# Patient Record
Sex: Male | Born: 1945 | Race: Black or African American | Hispanic: No | Marital: Married | Smoking: Heavy tobacco smoker
Health system: Southern US, Community
[De-identification: ages and names within clinical notes are randomized; demographics above are authoritative.]

## PROBLEM LIST (undated history)

## (undated) DIAGNOSIS — I10 Essential (primary) hypertension: Secondary | ICD-10-CM

## (undated) DIAGNOSIS — E119 Type 2 diabetes mellitus without complications: Secondary | ICD-10-CM

## (undated) DIAGNOSIS — K7460 Unspecified cirrhosis of liver: Secondary | ICD-10-CM

## (undated) HISTORY — PX: WRIST SURGERY: SHX841

---

## 2015-01-18 ENCOUNTER — Emergency Department (HOSPITAL_COMMUNITY)
Admission: EM | Admit: 2015-01-18 | Discharge: 2015-01-18 | Disposition: A | Discharge status: Home / Self Care | Payer: Medicare Other | Attending: Emergency Medicine | Admitting: Emergency Medicine

## 2015-01-18 ENCOUNTER — Encounter (HOSPITAL_COMMUNITY): Payer: Self-pay

## 2015-01-18 DIAGNOSIS — I10 Essential (primary) hypertension: Secondary | ICD-10-CM | POA: Diagnosis not present

## 2015-01-18 DIAGNOSIS — K920 Hematemesis: Secondary | ICD-10-CM | POA: Diagnosis present

## 2015-01-18 DIAGNOSIS — F17200 Nicotine dependence, unspecified, uncomplicated: Secondary | ICD-10-CM | POA: Insufficient documentation

## 2015-01-18 DIAGNOSIS — E119 Type 2 diabetes mellitus without complications: Secondary | ICD-10-CM | POA: Diagnosis not present

## 2015-01-18 HISTORY — DX: Unspecified cirrhosis of liver: K74.60

## 2015-01-18 HISTORY — DX: Type 2 diabetes mellitus without complications: E11.9

## 2015-01-18 HISTORY — DX: Essential (primary) hypertension: I10

## 2015-01-18 LAB — CBC WITH DIFFERENTIAL/PLATELET
BASOS ABS: 0 10*3/uL (ref 0.0–0.1)
Basophils Relative: 0 %
Eosinophils Absolute: 0.2 10*3/uL (ref 0.0–0.7)
Eosinophils Relative: 2 %
HEMATOCRIT: 38 % — AB (ref 39.0–52.0)
HEMOGLOBIN: 14 g/dL (ref 13.0–17.0)
LYMPHS PCT: 35 %
Lymphs Abs: 3.9 10*3/uL (ref 0.7–4.0)
MCH: 30.2 pg (ref 26.0–34.0)
MCHC: 36.8 g/dL — ABNORMAL HIGH (ref 30.0–36.0)
MCV: 82.1 fL (ref 78.0–100.0)
MONO ABS: 0.8 10*3/uL (ref 0.1–1.0)
Monocytes Relative: 7 %
NEUTROS ABS: 6.1 10*3/uL (ref 1.7–7.7)
Neutrophils Relative %: 56 %
Platelets: 172 10*3/uL (ref 150–400)
RBC: 4.63 MIL/uL (ref 4.22–5.81)
RDW: 13.1 % (ref 11.5–15.5)
WBC: 11 10*3/uL — ABNORMAL HIGH (ref 4.0–10.5)

## 2015-01-18 LAB — ABO/RH: ABO/RH(D): O NEG

## 2015-01-18 LAB — COMPREHENSIVE METABOLIC PANEL
ALK PHOS: 100 U/L (ref 38–126)
ALT: 25 U/L (ref 17–63)
AST: 28 U/L (ref 15–41)
Albumin: 3.8 g/dL (ref 3.5–5.0)
Anion gap: 11 (ref 5–15)
BILIRUBIN TOTAL: 0.6 mg/dL (ref 0.3–1.2)
BUN: 13 mg/dL (ref 6–20)
CALCIUM: 9.6 mg/dL (ref 8.9–10.3)
CO2: 23 mmol/L (ref 22–32)
CREATININE: 1.26 mg/dL — AB (ref 0.61–1.24)
Chloride: 102 mmol/L (ref 101–111)
GFR calc Af Amer: >60 mL/min (ref >60)
GFR, EST NON AFRICAN AMERICAN: 57 mL/min — AB (ref >60)
Glucose, Bld: 194 mg/dL — ABNORMAL HIGH (ref 65–99)
Potassium: 3.7 mmol/L (ref 3.5–5.1)
Sodium: 136 mmol/L (ref 135–145)
TOTAL PROTEIN: 7.8 g/dL (ref 6.5–8.1)

## 2015-01-18 LAB — TYPE AND SCREEN
ABO/RH(D): O NEG
Antibody Screen: NEGATIVE

## 2015-01-18 LAB — PROTIME-INR
INR: 1.08 (ref 0.00–1.49)
Prothrombin Time: 14.2 seconds (ref 11.6–15.2)

## 2015-01-18 LAB — LIPASE, BLOOD: LIPASE: 57 U/L — AB (ref 11–51)

## 2015-01-18 MED ORDER — PANTOPRAZOLE SODIUM 20 MG PO TBEC: 20.0000 mg | DELAYED_RELEASE_TABLET | Freq: Every day | ORAL | Status: DC

## 2015-01-18 MED ORDER — FAMOTIDINE 20 MG PO TABS: 20.0000 mg | ORAL_TABLET | Freq: Two times a day (BID) | ORAL | Status: AC

## 2015-01-18 NOTE — ED Notes (Addendum)
Pt arrives EMS with c/o viomiting blood approx 30 cc.pta.  intermittent vomiting over last 2 weeks and another episode this am.

## 2015-01-18 NOTE — Discharge Instructions (Signed)
Hematemesis Hematemesis is when you vomit blood. It is a sign of bleeding in the upper part of your digestive tract. This is also called your gastrointestinal (GI) tract. Your upper GI tract includes your mouth, throat, esophagus, stomach, and the first part of your small intestine (duodenum).  Hematemesis is usually caused by bleeding from your esophagus or stomach. You may suddenly vomit bright red blood. You might also vomit old blood. It may look like coffee grounds. You may also have other symptoms, such as:  Stomach pain.  Heartburn.  Black and tarry stool.  HOME CARE INSTRUCTIONS  Watch your hematemesis for any changes. The following actions may help to lessen any discomfort you are feeling:  Take medicines only as directed by your health care provider. Do not take aspirin, ibuprofen, or any other anti-inflammatory medicine without approval from your health care provider.  Rest as needed.  Drink small sips of clear liquids often, as long as you can keep them down. Try to drink enough fluids to keep your urine clear or pale yellow.  Do not drink alcohol.  Do not use any tobacco products, including cigarettes, chewing tobacco, or electronic cigarettes. If you need help quitting, ask your health care provider.  Keep all follow-up visits as directed by your health care provider. This is important. SEEK MEDICAL CARE IF:   The vomiting of blood worsens, or begins again after it has stopped.  You have persistent stomach pain.  You have nausea, indigestion, or heartburn.  You feel weak or dizzy. SEEK IMMEDIATE MEDICAL CARE IF:   You faint or feel extremely weak.  You have a rapid heartbeat.  You are urinating less than normal or not at all.  You have persistent vomiting.  You vomit large amounts of bloody or dark material.  You vomit bright red blood.  You pass large, dark, or bloody stools.  You have chest pain or trouble breathing.   This information is not  intended to replace advice given to you by your health care provider. Make sure you discuss any questions you have with your health care provider.   Document Released: 03/01/2004 Document Revised: 02/12/2014 Document Reviewed: 09/16/2013 Elsevier Interactive Patient Education 2016 [employer]  

## 2015-01-18 NOTE — ED Notes (Signed)
Pt hom e ambulatory with family after verbalizes understanding of instructions.

## 2015-01-18 NOTE — ED Provider Notes (Signed)
CSN: [id number]     Arrival date & time 01/18/15  1728 History   First MD Initiated Contact with Patient 01/18/15 1730     Chief Complaint  Patient presents with  . Hematemesis    Patient is a 69 y.o. male presenting with vomiting. The history is provided by the patient.  Emesis Severity:  Mild Timing:  Rare Number of daily episodes:  1 Quality:  Bright red blood Progression:  Resolved Chronicity:  New Relieved by:  Nothing Associated symptoms: no abdominal pain, no chills, no diarrhea, no fever, no headaches and no sore throat   Risk factors: alcohol use     Past Medical History  Diagnosis Date  . Hypertension   . Diabetes mellitus without complication (HCC)   . 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 Wrist surgery     Family History  Problem Relation Age of Onset  . Diabetes Mother   . Hypertension Mother    Social History  Substance Use Topics  . Smoking status: Heavy Tobacco Smoker  . Smokeless tobacco: None  . Alcohol Use: 4.8 oz/week    8 Shots of liquor per week     Comment: most days    Review of Systems  Constitutional: Negative for fever and chills.  HENT: Negative for rhinorrhea and sore throat.   Eyes: Negative for visual disturbance.  Respiratory: Negative for cough and shortness of breath.   Cardiovascular: Negative for chest pain.  Gastrointestinal: Positive for vomiting. Negative for nausea, abdominal pain, diarrhea, constipation, blood in stool, abdominal distention and anal bleeding.       Hematemesis  Genitourinary: Negative for dysuria and hematuria.  Musculoskeletal: Negative for back pain and neck pain.  Skin: Negative for rash.  Neurological: Negative for syncope and headaches.  Psychiatric/Behavioral: Negative for confusion.  All other systems reviewed and are negative.  Allergies  Review of patient's allergies indicates not on file.  Home Medications   Prior to Admission medications    Medication Sig Start Date End Date Taking? Authorizing Provider  famotidine (PEPCID) 20 MG tablet Take 1 tablet (20 mg total) by mouth 2 (two) times daily. 01/18/15   Maris BergerJonah Sedric Guia, MD  pantoprazole (PROTONIX) 20 MG tablet Take 1 tablet (20 mg total) by mouth daily. 01/18/15   Maris BergerJonah Hannelore Bova, MD   BP 171/99 mmHg  Pulse 82  Temp(Src) 98 F (36.7 C) (Oral)  Resp 18  Ht 5\' 9"  (1.753 m)  Wt 74.844 kg  BMI 24.36 kg/m2  SpO2 99% Physical Exam  Constitutional: He is oriented to person, place, and time. He appears well-developed and well-nourished. No distress.  HENT:  Head: Normocephalic and atraumatic.  Mouth/Throat: Oropharynx is clear and moist.  Eyes: EOM are normal. No scleral icterus.  Neck: Neck supple. No JVD present.  Cardiovascular: Normal rate, regular rhythm, normal heart sounds and intact distal pulses.   Pulmonary/Chest: Effort normal and breath sounds normal.  Abdominal: Soft. He exhibits no distension and no ascites. There is no hepatomegaly. There is no tenderness.  Musculoskeletal: Normal range of motion. He exhibits no edema.  Neurological: He is alert and oriented to person, place, and time. No cranial nerve deficit.  Skin: Skin is warm and dry.  Psychiatric: His behavior is normal.    ED Course  Procedures  None   Labs Review Labs Reviewed  CBC WITH DIFFERENTIAL/PLATELET - Abnormal; Notable for the following:    WBC 11.0 (*)    HCT  38.0 (*)    MCHC 36.8 (*)    All other components within normal limits  COMPREHENSIVE METABOLIC PANEL - Abnormal; Notable for the following:    Glucose, Bld 194 (*)    Creatinine, Ser 1.26 (*)    GFR calc non Af Amer 57 (*)    All other components within normal limits  LIPASE, BLOOD - Abnormal; Notable for the following:    Lipase 57 (*)    All other components within normal limits  PROTIME-INR  TYPE AND SCREEN  ABO/RH   MDM   Final diagnoses:  Hematemesis without nausea    Patient is a 69 year-old [place] American  male with history of chronic alcohol abuse and cirrhosis who presents with hematemesis. Patient was at [facility] and had proximal May 30 cc of grayish red blood in his emesis. He is currently asymptomatic. There is no syncope or presyncope. Abdominal exam is unremarkable. He is he mechanically stable. Will obtain labs and check liver function. Patient denies melena or hematochezia. Differential includes gastritis secondary to alcohol abuse, DeLefoy lesion, Mallory Weiss tear. Lipase mildly elevated. Normal PT and albumin so believe synthetic liver function is intact. Creatinine 1.26 with unknown baseline. She has remained he mechanically stable without any episodes of vomiting or hematemesis. Fill this is not an acute need for endoscopy.  Patient's hemoglobin is within normal limits. Patient had no episodes of emesis or hematemesis in the ED. Remains in an anatomically stable. Asymptomatic at this time. Discussed follow-up with GI. Patient sees gastroenterology at the [facility] so we encouraged him to call and make appointment with them or with [facility]. Will prescribe Protonix and Pepcid for outpatient management of gastritis. Patient stable for discharge this time.  Discussed with Dr. Jeraldine Loots.  Maris Berger, MD 01/18/15 7829  Gerhard Munch, MD 01/21/15 2352

## 2015-01-25 ENCOUNTER — Encounter (HOSPITAL_COMMUNITY): Payer: Self-pay

## 2015-01-25 ENCOUNTER — Emergency Department (HOSPITAL_COMMUNITY): Payer: Medicare Other

## 2015-01-25 ENCOUNTER — Inpatient Hospital Stay (HOSPITAL_COMMUNITY)
Admission: EM | Admit: 2015-01-25 | Discharge: 2015-01-26 | DRG: 071 | Disposition: A | Discharge status: Home / Self Care | Payer: Medicare Other | Attending: Internal Medicine | Admitting: Internal Medicine

## 2015-01-25 DIAGNOSIS — E119 Type 2 diabetes mellitus without complications: Secondary | ICD-10-CM

## 2015-01-25 DIAGNOSIS — D72829 Elevated white blood cell count, unspecified: Secondary | ICD-10-CM | POA: Diagnosis present

## 2015-01-25 DIAGNOSIS — K7460 Unspecified cirrhosis of liver: Secondary | ICD-10-CM | POA: Diagnosis present

## 2015-01-25 DIAGNOSIS — F1010 Alcohol abuse, uncomplicated: Secondary | ICD-10-CM | POA: Diagnosis not present

## 2015-01-25 DIAGNOSIS — I9589 Other hypotension: Secondary | ICD-10-CM | POA: Diagnosis not present

## 2015-01-25 DIAGNOSIS — R040 Epistaxis: Secondary | ICD-10-CM | POA: Diagnosis present

## 2015-01-25 DIAGNOSIS — F1290 Cannabis use, unspecified, uncomplicated: Secondary | ICD-10-CM | POA: Diagnosis not present

## 2015-01-25 DIAGNOSIS — Z79899 Other long term (current) drug therapy: Secondary | ICD-10-CM | POA: Diagnosis not present

## 2015-01-25 DIAGNOSIS — G9340 Encephalopathy, unspecified: Secondary | ICD-10-CM | POA: Diagnosis present

## 2015-01-25 DIAGNOSIS — N179 Acute kidney failure, unspecified: Secondary | ICD-10-CM | POA: Diagnosis not present

## 2015-01-25 DIAGNOSIS — Z7984 Long term (current) use of oral hypoglycemic drugs: Secondary | ICD-10-CM | POA: Diagnosis not present

## 2015-01-25 DIAGNOSIS — R4781 Slurred speech: Secondary | ICD-10-CM | POA: Diagnosis not present

## 2015-01-25 DIAGNOSIS — I10 Essential (primary) hypertension: Secondary | ICD-10-CM | POA: Diagnosis present

## 2015-01-25 DIAGNOSIS — R739 Hyperglycemia, unspecified: Secondary | ICD-10-CM

## 2015-01-25 DIAGNOSIS — D649 Anemia, unspecified: Secondary | ICD-10-CM | POA: Diagnosis present

## 2015-01-25 DIAGNOSIS — E1165 Type 2 diabetes mellitus with hyperglycemia: Secondary | ICD-10-CM | POA: Diagnosis not present

## 2015-01-25 DIAGNOSIS — I959 Hypotension, unspecified: Secondary | ICD-10-CM | POA: Diagnosis not present

## 2015-01-25 LAB — URINALYSIS, ROUTINE W REFLEX MICROSCOPIC
Glucose, UA: NEGATIVE mg/dL
Hgb urine dipstick: NEGATIVE
KETONES UR: 15 mg/dL — AB
LEUKOCYTES UA: NEGATIVE
NITRITE: NEGATIVE
PH: 6.5 (ref 5.0–8.0)
Protein, ur: 100 mg/dL — AB
SPECIFIC GRAVITY, URINE: 1.018 (ref 1.005–1.030)

## 2015-01-25 LAB — PROTIME-INR
INR: 1.13 (ref 0.00–1.49)
PROTHROMBIN TIME: 14.7 s (ref 11.6–15.2)

## 2015-01-25 LAB — RAPID URINE DRUG SCREEN, HOSP PERFORMED
Amphetamines: NOT DETECTED
Barbiturates: NOT DETECTED
Benzodiazepines: NOT DETECTED
Cocaine: NOT DETECTED
OPIATES: NOT DETECTED
Tetrahydrocannabinol: POSITIVE — AB

## 2015-01-25 LAB — I-STAT CHEM 8, ED
BUN: 13 mg/dL (ref 6–20)
CALCIUM ION: 0.96 mmol/L — AB (ref 1.13–1.30)
CHLORIDE: 102 mmol/L (ref 101–111)
Creatinine, Ser: 1.9 mg/dL — ABNORMAL HIGH (ref 0.61–1.24)
GLUCOSE: 253 mg/dL — AB (ref 65–99)
HCT: 31 % — ABNORMAL LOW (ref 39.0–52.0)
Hemoglobin: 10.5 g/dL — ABNORMAL LOW (ref 13.0–17.0)
Potassium: 3.7 mmol/L (ref 3.5–5.1)
Sodium: 136 mmol/L (ref 135–145)
TCO2: 22 mmol/L (ref 0–100)

## 2015-01-25 LAB — COMPREHENSIVE METABOLIC PANEL
ALK PHOS: 86 U/L (ref 38–126)
ALT: 19 U/L (ref 17–63)
ANION GAP: 11 (ref 5–15)
AST: 26 U/L (ref 15–41)
Albumin: 3.5 g/dL (ref 3.5–5.0)
BILIRUBIN TOTAL: 0.9 mg/dL (ref 0.3–1.2)
BUN: 12 mg/dL (ref 6–20)
CALCIUM: 9.1 mg/dL (ref 8.9–10.3)
CO2: 22 mmol/L (ref 22–32)
Chloride: 102 mmol/L (ref 101–111)
Creatinine, Ser: 2.09 mg/dL — ABNORMAL HIGH (ref 0.61–1.24)
GFR calc non Af Amer: 31 mL/min — ABNORMAL LOW (ref >60)
GFR, EST AFRICAN AMERICAN: 36 mL/min — AB (ref >60)
Glucose, Bld: 285 mg/dL — ABNORMAL HIGH (ref 65–99)
Potassium: 4.9 mmol/L (ref 3.5–5.1)
SODIUM: 135 mmol/L (ref 135–145)
TOTAL PROTEIN: 6.7 g/dL (ref 6.5–8.1)

## 2015-01-25 LAB — URINE MICROSCOPIC-ADD ON: Squamous Epithelial / LPF: NONE SEEN

## 2015-01-25 LAB — CBC
HCT: 28.4 % — ABNORMAL LOW (ref 39.0–52.0)
Hemoglobin: 10.3 g/dL — ABNORMAL LOW (ref 13.0–17.0)
MCH: 30.5 pg (ref 26.0–34.0)
MCHC: 36.3 g/dL — ABNORMAL HIGH (ref 30.0–36.0)
MCV: 84 fL (ref 78.0–100.0)
PLATELETS: 179 10*3/uL (ref 150–400)
RBC: 3.38 MIL/uL — AB (ref 4.22–5.81)
RDW: 13.6 % (ref 11.5–15.5)
WBC: 13.2 10*3/uL — ABNORMAL HIGH (ref 4.0–10.5)

## 2015-01-25 LAB — CREATININE, URINE, RANDOM: CREATININE, URINE: 360.23 mg/dL

## 2015-01-25 LAB — I-STAT CG4 LACTIC ACID, ED: LACTIC ACID, VENOUS: 3.81 mmol/L — AB (ref 0.5–2.0)

## 2015-01-25 LAB — CBG MONITORING, ED: GLUCOSE-CAPILLARY: 269 mg/dL — AB (ref 65–99)

## 2015-01-25 LAB — DIFFERENTIAL
Basophils Absolute: 0 10*3/uL (ref 0.0–0.1)
Basophils Relative: 0 %
EOS PCT: 0 %
Eosinophils Absolute: 0 10*3/uL (ref 0.0–0.7)
LYMPHS ABS: 1.9 10*3/uL (ref 0.7–4.0)
LYMPHS PCT: 14 %
MONO ABS: 0.8 10*3/uL (ref 0.1–1.0)
Monocytes Relative: 6 %
Neutro Abs: 10.4 10*3/uL — ABNORMAL HIGH (ref 1.7–7.7)
Neutrophils Relative %: 80 %

## 2015-01-25 LAB — I-STAT TROPONIN, ED: Troponin i, poc: 0 ng/mL (ref 0.00–0.08)

## 2015-01-25 LAB — SODIUM, URINE, RANDOM: Sodium, Ur: 81 mmol/L

## 2015-01-25 LAB — APTT: aPTT: 23 seconds — ABNORMAL LOW (ref 24–37)

## 2015-01-25 MED ORDER — THIAMINE HCL 100 MG/ML IJ SOLN
Freq: Once | INTRAVENOUS | Status: AC
  Administered 2015-01-26: 01:00:00 via INTRAVENOUS
  Filled 2015-01-25: qty 1000

## 2015-01-25 MED ORDER — SODIUM CHLORIDE 0.9 % IV BOLUS (SEPSIS)
1000.0000 mL | Freq: Once | INTRAVENOUS | Status: AC
  Administered 2015-01-25: 1000 mL via INTRAVENOUS

## 2015-01-25 MED ORDER — SODIUM CHLORIDE 0.9 % IV BOLUS (SEPSIS)
500.0000 mL | INTRAVENOUS | Status: AC
  Administered 2015-01-25: 500 mL via INTRAVENOUS

## 2015-01-25 MED ORDER — LORAZEPAM 1 MG PO TABS: 1.0000 mg | ORAL_TABLET | Freq: Four times a day (QID) | ORAL | Status: DC | PRN

## 2015-01-25 MED ORDER — THIAMINE HCL 100 MG/ML IJ SOLN: 100.0000 mg | Freq: Every day | INTRAMUSCULAR | Status: DC

## 2015-01-25 MED ORDER — ADULT MULTIVITAMIN W/MINERALS CH
1.0000 | ORAL_TABLET | Freq: Every day | ORAL | Status: DC
Start: 2015-01-26 — End: 2015-01-26

## 2015-01-25 MED ORDER — VITAMIN B-1 100 MG PO TABS: 100.0000 mg | ORAL_TABLET | Freq: Every day | ORAL | Status: DC

## 2015-01-25 MED ORDER — INSULIN ASPART 100 UNIT/ML ~~LOC~~ SOLN
0.0000 [IU] | SUBCUTANEOUS | Status: DC
  Administered 2015-01-26: 05:00:00 via SUBCUTANEOUS
  Administered 2015-01-26: 5 [IU] via SUBCUTANEOUS

## 2015-01-25 MED ORDER — FOLIC ACID 1 MG PO TABS: 1.0000 mg | ORAL_TABLET | Freq: Every day | ORAL | Status: DC

## 2015-01-25 MED ORDER — LORAZEPAM 2 MG/ML IJ SOLN: 1.0000 mg | Freq: Four times a day (QID) | INTRAMUSCULAR | Status: DC | PRN

## 2015-01-25 NOTE — ED Notes (Signed)
Patient transported to CT 

## 2015-01-25 NOTE — ED Notes (Signed)
Attempted to call report

## 2015-01-25 NOTE — ED Provider Notes (Signed)
CSN: [id number]     Arrival date & time 01/25/15  1953 History   First MD Initiated Contact with Patient 01/25/15 1959     Chief Complaint  Patient presents with  . Hypotension  . Altered Mental Status     (Consider location/radiation/quality/duration/timing/severity/associated sxs/prior Treatment) HPI Comments: Patient here complaining of weakness after smoking a blunt just prior to arrival. Patient is a very poor historian and family noted that the patient did have some altered mental status as well as slurred speech. EMS arrived and blood sugar was above 100. Patient had no gross focal deficits. HEENT no facial drooping. There is no unilateral weakness. He was transported here for further management. Patient is an alcoholic and has not had any alcohol for overweight. There was no reported history of seizure activity. He has had some diarrhea that was described as nonbloody.  Patient is a 69 y.o. male presenting with altered mental status. The history is provided by the patient.  Altered Mental Status   Past Medical History  Diagnosis Date  . Hypertension   . Diabetes mellitus without complication (HCC)   . Cirrhosis of liver [facility](HCC)    Past Surgical History  Procedure Laterality Date  . Wrist surgery     Family History  Problem Relation Age of Onset  . Diabetes Mother   . Hypertension Mother    Social History  Substance Use Topics  . Smoking status: Heavy Tobacco Smoker  . Smokeless tobacco: None  . Alcohol Use: 4.8 oz/week    8 Shots of liquor per week     Comment: most days    Review of Systems  All other systems reviewed and are negative.     Allergies  Review of patient's allergies indicates no known allergies.  Home Medications   Prior to Admission medications   Medication Sig Start Date End Date Taking? Authorizing Provider  glipiZIDE (GLUCOTROL) 10 MG tablet Take 10 mg by mouth 2 (two) times daily before a meal.   Yes Historical Provider, MD   hydrochlorothiazide (HYDRODIURIL) 25 MG tablet Take 25 mg by mouth daily.   Yes Historical Provider, MD  lisinopril (PRINIVIL,ZESTRIL) 40 MG tablet Take 20 mg by mouth daily.   Yes Historical Provider, MD  Multiple Vitamin (MULTIVITAMIN WITH MINERALS) TABS tablet Take 1 tablet by mouth daily.   Yes Historical Provider, MD  omeprazole (PRILOSEC) 20 MG capsule Take 20 mg by mouth 2 (two) times daily as needed (for heartburn).   Yes Historical Provider, MD  pravastatin (PRAVACHOL) 40 MG tablet Take 20 mg by mouth daily.   Yes Historical Provider, MD  risperidone (RISPERDAL) 4 MG tablet Take 4 mg by mouth at bedtime.   Yes Historical Provider, MD  famotidine (PEPCID) 20 MG tablet Take 1 tablet (20 mg total) by mouth 2 (two) times daily. 01/18/15   Maris BergerJonah Gunalda, MD  pantoprazole (PROTONIX) 20 MG tablet Take 1 tablet (20 mg total) by mouth daily. 01/18/15   Maris BergerJonah Gunalda, MD   BP 84/53 mmHg  Pulse 71  Temp(Src) 97.7 F (36.5 C) (Oral)  Resp 21  SpO2 92% Physical Exam  Constitutional: He is oriented to person, place, and time. He appears lethargic. He appears cachectic.  Non-toxic appearance. He has a sickly appearance. He appears ill. No distress.  HENT:  Head: Normocephalic and atraumatic.  Eyes: Conjunctivae, EOM and lids are normal. Pupils are equal, round, and reactive to light.  Neck: Normal range of motion. Neck supple. No tracheal deviation present. No thyroid  mass present.  Cardiovascular: Normal rate, regular rhythm and normal heart sounds.  Exam reveals no gallop.   No murmur heard. Pulmonary/Chest: Effort normal and breath sounds normal. No stridor. No respiratory distress. He has no decreased breath sounds. He has no wheezes. He has no rhonchi. He has no rales.  Abdominal: Soft. Normal appearance and bowel sounds are normal. He exhibits no distension. There is no tenderness. There is no rebound and no CVA tenderness.  Musculoskeletal: Normal range of motion. He exhibits no edema or  tenderness.  Neurological: He is oriented to person, place, and time. He appears lethargic. He displays no tremor. No cranial nerve deficit or sensory deficit. He displays no seizure activity. GCS eye subscore is 4. GCS verbal subscore is 5. GCS motor subscore is 6.  Skin: Skin is warm and dry. No abrasion and no rash noted.  Psychiatric: His affect is labile. His speech is delayed. He is slowed.  Nursing note and vitals reviewed.   ED Course  Procedures (including critical care time) Labs Review Labs Reviewed  CBG MONITORING, ED - Abnormal; Notable for the following:    Glucose-Capillary 269 (*)    All other components within normal limits  CULTURE, BLOOD (ROUTINE X 2)  CULTURE, BLOOD (ROUTINE X 2)  URINE CULTURE  ETHANOL  PROTIME-INR  APTT  CBC  DIFFERENTIAL  COMPREHENSIVE METABOLIC PANEL  URINE RAPID DRUG SCREEN, HOSP PERFORMED  URINALYSIS, ROUTINE W REFLEX MICROSCOPIC (NOT AT [facility])  I-STAT CHEM 8, ED  I-STAT TROPOININ, ED  I-STAT CG4 LACTIC ACID, ED    Imaging Review No results found. I have personally reviewed and evaluated these images and lab results as part of my medical decision-making.   EKG Interpretation   Date/Time:  Tuesday January 25 2015 20:28:51 EST Ventricular Rate:  78 PR Interval:  164 QRS Duration: 86 QT Interval:  386 QTC Calculation: 440 R Axis:   19 Text Interpretation:  Sinus rhythm Probable left atrial enlargement  Abnormal R-wave progression, early transition Confirmed by Correll Denbow  MD,  Tannya Gonet ([id number]) on 01/25/2015 8:50:30 PM      MDM   Final diagnoses:  None    Patient given IV fluids here for his hypotension. Suspect that this was drug-induced or likely from dehydration as he has evidence of acute kidney injury. Has no status has improved. Head CT is negative. Patient's elevated lactate also noted. He is afebrile at this time. Patient to be admitted to the hospital  CRITICAL CARE Performed by: Toy Baker Total critical care  time: 60 minutes Critical care time was exclusive of separately billable procedures and treating other patients. Critical care was necessary to treat or prevent imminent or life-threatening deterioration. Critical care was time spent personally by me on the following activities: development of treatment plan with patient and/or surrogate as well as nursing, discussions with consultants, evaluation of patient's response to treatment, examination of patient, obtaining history from patient or surrogate, ordering and performing treatments and interventions, ordering and review of laboratory studies, ordering and review of radiographic studies, pulse oximetry and re-evaluation of patient's condition.    Lorre Nick, MD 01/25/15 [phone]

## 2015-01-25 NOTE — H&P (Signed)
[facility] History and Physical  Joson Dickenson WUJ:[id number] DOB: 06/13/1945 DOA: 01/25/2015  Referring Dorita Frayphysician: ED PCP: No PCP Per Patient   Chief Complaint: Slurred speech and disorientation  HPI:  Patient is a 69 year old male with a past medical history of alcohol abuse, tobacco abuse, hypertension, diabetes mellitus type 2, and reported cirrhosis of the liver; who presents with complaints of slurred speech and disorientation. Patient wife and family member are present at bedside and give history as patient does not recall the events in question. Family notes he was last seen normal around 5 or 6 PM this afternoon. The patient himself notes that around 4 PM that he smoked some weed that he got from some person off the street. Patient was found to have slurred speech and was disoriented not recognizing family members around 6:30 PM. And noted associated symptoms of patient stumbling around but denies any falls. They denied any signs of facial droop or focal weakness. The patient notes smokes weed occasionally and reports that he quit drinking alcohol last week. He reports that he is intermittently had tremors off and on during this past week. Also complaining of what sounds like constipation for which she took a laxative this afternoon, but has not had a bowel movement yet.  Patient also reports a three-week history of nosebleeds off and on which she has a appointment at the [facility] to see his primary care doctor tomorrow. [facility]eds for which I started him on Protonix.   Upon admission into the emergency department the patient was initially labeled as a code stroke. Initial CT of the head was negative. After more history obtained code stroke was canceled and UDS was found to be positive for marijuana.    Review of Systems  Constitutional: Negative for fever and chills.  HENT: Positive for nosebleeds.   Eyes: Negative for  photophobia and pain.  Respiratory: Positive for cough. Negative for shortness of breath and stridor.   Cardiovascular: Negative for chest pain and palpitations.  Gastrointestinal: Negative for abdominal pain and diarrhea.  Genitourinary: Negative for frequency and hematuria.  Skin: Negative for itching and rash.  Neurological: Positive for speech change. Negative for focal weakness.  Endo/Heme/Allergies: Negative for environmental allergies. Bruises/bleeds easily.  Psychiatric/Behavioral: Positive for memory loss. The patient is nervous/anxious.        Confusion       Past Medical History  Diagnosis Date  . Hypertension   . Diabetes mellitus without complication (HCC)   . Cirrhosis of liver [facility](HCC)      Past Surgical History  Procedure Laterality Date  . Wrist surgery        Social History:  reports that he has been smoking.  He does not have any smokeless tobacco history on file. He reports that he drinks about 4.8 oz of alcohol per week. He reports that he uses illicit drugs (Marijuana). Where does patient live--home and with whom if at home? wife Can patient participate in ADLs?  No Known Allergies  Family History  Problem Relation Age of Onset  . Diabetes Mother   . Hypertension Mother         Prior to Admission medications   Medication Sig Start Date End Date Taking? Authorizing Provider  glipiZIDE (GLUCOTROL) 10 MG tablet Take 10 mg by mouth 2 (two) times daily before a meal.   Yes Historical Provider, MD  hydrochlorothiazide (HYDRODIURIL) 25 MG tablet Take 25 mg by  mouth daily.   Yes Historical Provider, MD  lisinopril (PRINIVIL,ZESTRIL) 40 MG tablet Take 20 mg by mouth daily.   Yes Historical Provider, MD  Multiple Vitamin (MULTIVITAMIN WITH MINERALS) TABS tablet Take 1 tablet by mouth daily.   Yes Historical Provider, MD  omeprazole (PRILOSEC) 20 MG capsule Take 20 mg by mouth 2 (two) times daily as needed (for heartburn).   Yes Historical Provider, MD   pravastatin (PRAVACHOL) 40 MG tablet Take 20 mg by mouth daily.   Yes Historical Provider, MD  risperidone (RISPERDAL) 4 MG tablet Take 4 mg by mouth at bedtime.   Yes Historical Provider, MD  famotidine (PEPCID) 20 MG tablet Take 1 tablet (20 mg total) by mouth 2 (two) times daily. 01/18/15   Maris Berger, MD  pantoprazole (PROTONIX) 20 MG tablet Take 1 tablet (20 mg total) by mouth daily. 01/18/15   Maris Berger, MD     Physical Exam: Filed Vitals:   01/25/15 2145 01/25/15 2200 01/25/15 2215 01/25/15 2230  BP: 120/72 132/71 115/70 133/83  Pulse: 70 74 81 71  Temp:      TempSrc:      Resp: 19 18 24 19   Height:      Weight:      SpO2: 97% 100% 100% 95%     Constitutional: Vital signs reviewed. Patient is a well-developed and well-nourished in no acute distress and cooperative with exam. Alert and oriented 2 Head: Normocephalic and atraumatic  Ear: TM normal bilaterally  Nose: Right nare with a mucosal tear of the lateral wall Mouth: no erythema or exudates, MMM  Eyes: PERRL, EOMI, conjunctivae normal, No scleral icterus.  Neck: Supple, Trachea midline normal ROM, No JVD, mass, thyromegaly, or carotid bruit present.  Cardiovascular: RRR, S1 normal, S2 normal, no MRG, pulses symmetric and intact bilaterally  Pulmonary/Chest: CTAB, no wheezes, rales, or rhonchi  Abdominal: Soft. Non-tender, non-distended, bowel sounds are normal, no masses, organomegaly, or guarding present.  GU: no CVA tenderness Musculoskeletal: No joint deformities, erythema, or stiffness, ROM full and no nontender Ext: no edema and no cyanosis, pulses palpable bilaterally (DP and PT)  Hematology: no cervical, inginal, or axillary adenopathy.  Neurological: A&O x2, Strenght is normal and symmetric bilaterally, cranial nerve II-XII are grossly intact, no focal motor deficit, sensory intact to light touch bilaterally.  Skin: Warm, dry and intact. No rash, cyanosis, or clubbing.  Psychiatric: Patient appears  slightly agitated with decreased memory of events and circumstances coming into the hospital      Data Review   Micro Results Recent Results (from the past 240 hour(s))  Blood Culture (routine x 2)     Status: None (Preliminary result)   Collection Time: 01/25/15  8:54 PM  Result Value Ref Range Status   Specimen Description BLOOD LEFT HAND  Final   Special Requests IN PEDIATRIC BOTTLE 3CC  Final   Culture PENDING  Incomplete   Report Status PENDING  Incomplete    Radiology Reports Ct Head Wo Contrast  01/25/2015  CLINICAL DATA:  Sudden onset of altered mental status and slurred speech today at 6:30 p.m. EXAM: CT HEAD WITHOUT CONTRAST TECHNIQUE: Contiguous axial images were obtained from the base of the skull through the vertex without intravenous contrast. COMPARISON:  None. FINDINGS: The ventricles are in the midline without mass effect or shift. They are normal in size and configuration for age and degree of cerebral atrophy no CT findings for hemispheric infarction or intracranial hemorrhage. Benign-appearing basal ganglia calcifications. No extra-axial fluid  collections are identified no mass lesions. The brainstem and cerebellum are grossly normal. The bony structures are intact. The paranasal sinuses and mastoid air cells are clear. The globes are intact. IMPRESSION: No acute intracranial findings or mass lesions. Electronically Signed   By: Rudie Meyer M.D.   On: 01/25/2015 21:38   Dg Chest Port 1 View  01/25/2015  CLINICAL DATA:  Shortness of Breath EXAM: PORTABLE CHEST - 1 VIEW COMPARISON:  None. FINDINGS: The heart size and mediastinal contours are within normal limits. Both lungs are clear. The visualized skeletal structures are unremarkable. IMPRESSION: No active disease. Electronically Signed   By: Alcide Clever M.D.   On: 01/25/2015 21:05     CBC  Recent Labs Lab 01/25/15 2018 01/25/15 2054  WBC  --  13.2*  HGB 10.5* 10.3*  HCT 31.0* 28.4*  PLT  --  179  MCV  --   84.0  MCH  --  30.5  MCHC  --  36.3*  RDW  --  13.6  LYMPHSABS  --  1.9  MONOABS  --  0.8  EOSABS  --  0.0  BASOSABS  --  0.0    Chemistries   Recent Labs Lab 01/25/15 2018 01/25/15 2054  NA 136 135  K 3.7 4.9  CL 102 102  CO2  --  22  GLUCOSE 253* 285*  BUN 13 12  CREATININE 1.90* 2.09*  CALCIUM  --  9.1  AST  --  26  ALT  --  19  ALKPHOS  --  86  BILITOT  --  0.9   ------------------------------------------------------------------------------------------------------------------ estimated creatinine clearance is 34.3 mL/min (by C-G formula based on Cr of 2.09). ------------------------------------------------------------------------------------------------------------------ No results for input(s): HGBA1C in the last 72 hours. ------------------------------------------------------------------------------------------------------------------ No results for input(s): CHOL, HDL, LDLCALC, TRIG, CHOLHDL, LDLDIRECT in the last 72 hours. ------------------------------------------------------------------------------------------------------------------ No results for input(s): TSH, T4TOTAL, T3FREE, THYROIDAB in the last 72 hours.  Invalid input(s): FREET3 ------------------------------------------------------------------------------------------------------------------ No results for input(s): VITAMINB12, FOLATE, FERRITIN, TIBC, IRON, RETICCTPCT in the last 72 hours.  Coagulation profile  Recent Labs Lab 01/25/15 2054  INR 1.13    No results for input(s): DDIMER in the last 72 hours.  Cardiac Enzymes No results for input(s): CKMB, TROPONINI, MYOGLOBIN in the last 168 hours.  Invalid input(s): CK ------------------------------------------------------------------------------------------------------------------ Invalid input(s): POCBNP   CBG:  Recent Labs Lab 01/25/15 1955  GLUCAP 269*       EKG: Independently reviewed. Sinus rhythm with  LAA   Assessment/Plan Active Problems:    Acute encephalopathy: Family reports acute onset of slurred speech and disorientation around 6:30 PM. Patient reports smoking a blunt around 4 PM. Initial CT scan of the brain negative for any acute abnormality. UDS positive for marijuana so suspect that this is likely cause for patient's acute altered status however cannot rule out a stroke versus alcohol related. - Admit to Telemetry bed - Neuro checks every 2 hours - Physical therapy to eval and treat  - Reassess in a.m. for possible need of MRI  Acute kidney injury : Patient's creatinine was elevated to 2.09 on admission, baseline creatinine previously was somewhere around 1.2 about 7 days ago. - IV fluids - Urine studies FeNa  - Renal ultrasound - Hold hydrochlorothiazide and lisinopril - Follow-up BMP in a.m.    Hypotension in a patient with a history of hypertension:   Improved with IV fluids initial blood pressure was 84/53 now BP .  - Held home medications of lisinopril/ hydrochlorothiazide  Leukocytosis: WBC  13.2 Possibly reactive.  Although patient was found to have many bacteria on UA with no LE, nitrate, and/or squamous epithelial cells seen. - checking urine culture question the need of STD screening - IV Rocephin  Anemia: Hemoglobin appears to be stable at 10.3:  - Repeat CBC in a.m.    Diabetes mellitus with hyperglycemia : Patient found to have elevated blood glucose of 285 on admission with a anion gap of 11. - check HbgA1c  - CBG's with sliding scale insulin every 4 hours   Alcohol abuse history: Patient notes last drink one week ago. Question of possibility of alcohol withdrawal. - Banana bag - CWIAA protocol  Recurrent nose bleeds: Patient found to have a mucosal tear of the right nare. Patient scheduled to follow-up with primary care tomorrow regarding this issue. - Continue to monitor consider afrin nasal spray or rhino rocket if reoccurs  Code Status:    full Family Communication: bedside Disposition Plan: admit   Total time spent 55 minutes.Greater than 50% of this time was spent in counseling, explanation of diagnosis, planning of further management, and coordination of care  Clydie Braun [facility] Pager [phone]  If 7PM-7AM, please contact night-coverage [email or web address] Password [facility] 01/25/2015, 10:41 PM

## 2015-01-25 NOTE — ED Notes (Signed)
Phlebotomy at bedside.

## 2015-01-25 NOTE — ED Notes (Signed)
Pt bleeding from nose. Pressure applied to bridge of nose. Bleeding did stop.  Dr. Katrinka BlazingSmith at bedside.

## 2015-01-25 NOTE — ED Notes (Signed)
Pt requested food, per Dr Freida BusmanAllen; Pt is allowed to have something to eat and drink. Meal given

## 2015-01-25 NOTE — ED Notes (Signed)
Pt states "I'v been taking a laxative".

## 2015-01-25 NOTE — ED Notes (Signed)
Unable to start second IV

## 2015-01-25 NOTE — ED Notes (Signed)
Per [facility]: Pts family reports sudden onset of altered mental status and slurred speech at 6:30 pm today. Family reports that this is unusual behavior for the pt. Pt does report "smoking 1 blunt" prior to EMS arrival. Pt states that his behavior started after he smoked. Pt has not had any alcohol for 1 week and has reported tremors on and off during the week.  Family reports diarrhea for a few weeks with some abdominal pain.  Nose bleeds on and off for a few weeks, per family. No trauma, no seizures, no recent accidents, no facial dropping, no arm or leg drop. Pt does have outbursts of cursing at staff.

## 2015-01-25 NOTE — ED Notes (Signed)
MD at bedside. 

## 2015-01-26 ENCOUNTER — Encounter (HOSPITAL_COMMUNITY): Payer: Self-pay | Admitting: *Deleted

## 2015-01-26 DIAGNOSIS — N179 Acute kidney failure, unspecified: Secondary | ICD-10-CM | POA: Diagnosis not present

## 2015-01-26 DIAGNOSIS — G9340 Encephalopathy, unspecified: Principal | ICD-10-CM

## 2015-01-26 LAB — BASIC METABOLIC PANEL
Anion gap: 9 (ref 5–15)
BUN: 14 mg/dL (ref 6–20)
CHLORIDE: 106 mmol/L (ref 101–111)
CO2: 22 mmol/L (ref 22–32)
CREATININE: 1.44 mg/dL — AB (ref 0.61–1.24)
Calcium: 8.9 mg/dL (ref 8.9–10.3)
GFR calc Af Amer: 57 mL/min — ABNORMAL LOW (ref >60)
GFR calc non Af Amer: 49 mL/min — ABNORMAL LOW (ref >60)
GLUCOSE: 157 mg/dL — AB (ref 65–99)
Potassium: 4.1 mmol/L (ref 3.5–5.1)
SODIUM: 137 mmol/L (ref 135–145)

## 2015-01-26 LAB — URINE CULTURE: Culture: 2000

## 2015-01-26 LAB — CBC
HCT: 27 % — ABNORMAL LOW (ref 39.0–52.0)
Hemoglobin: 9.7 g/dL — ABNORMAL LOW (ref 13.0–17.0)
MCH: 30 pg (ref 26.0–34.0)
MCHC: 35.9 g/dL (ref 30.0–36.0)
MCV: 83.6 fL (ref 78.0–100.0)
PLATELETS: 189 10*3/uL (ref 150–400)
RBC: 3.23 MIL/uL — ABNORMAL LOW (ref 4.22–5.81)
RDW: 13.4 % (ref 11.5–15.5)
WBC: 12.3 10*3/uL — AB (ref 4.0–10.5)

## 2015-01-26 LAB — GLUCOSE, CAPILLARY
Glucose-Capillary: 298 mg/dL — ABNORMAL HIGH (ref 65–99)
Glucose-Capillary: 183 mg/dL — ABNORMAL HIGH (ref 65–99)
Glucose-Capillary: 70 mg/dL (ref 65–99)

## 2015-01-26 LAB — CG4 I-STAT (LACTIC ACID): Lactic Acid, Venous: 2.27 mmol/L (ref 0.5–2.0)

## 2015-01-26 MED ORDER — FAMOTIDINE 20 MG PO TABS
20.0000 mg | ORAL_TABLET | Freq: Two times a day (BID) | ORAL | Status: DC
  Administered 2015-01-26: 20 mg via ORAL
  Filled 2015-01-26: qty 1

## 2015-01-26 MED ORDER — RISPERIDONE 1 MG PO TABS
4.0000 mg | ORAL_TABLET | Freq: Every day | ORAL | Status: DC
  Administered 2015-01-26: 4 mg via ORAL
  Filled 2015-01-26: qty 4

## 2015-01-26 MED ORDER — CIPROFLOXACIN HCL 250 MG PO TABS: 250.0000 mg | ORAL_TABLET | Freq: Two times a day (BID) | ORAL | Status: AC

## 2015-01-26 MED ORDER — ONDANSETRON HCL 4 MG/2ML IJ SOLN
4.0000 mg | Freq: Four times a day (QID) | INTRAMUSCULAR | Status: DC | PRN
Start: 2015-01-26 — End: 2015-01-26

## 2015-01-26 MED ORDER — HEPARIN SODIUM (PORCINE) 5000 UNIT/ML IJ SOLN
5000.0000 [IU] | Freq: Three times a day (TID) | INTRAMUSCULAR | Status: DC
  Administered 2015-01-26: 5000 [IU] via SUBCUTANEOUS
  Filled 2015-01-26: qty 1

## 2015-01-26 MED ORDER — ADULT MULTIVITAMIN W/MINERALS CH: 1.0000 | ORAL_TABLET | Freq: Every day | ORAL | Status: DC

## 2015-01-26 MED ORDER — PANTOPRAZOLE SODIUM 40 MG PO TBEC: 40.0000 mg | DELAYED_RELEASE_TABLET | Freq: Every day | ORAL | Status: DC

## 2015-01-26 MED ORDER — FOLIC ACID 1 MG PO TABS: 1.0000 mg | ORAL_TABLET | Freq: Every day | ORAL | Status: AC

## 2015-01-26 MED ORDER — ONDANSETRON HCL 4 MG PO TABS: 4.0000 mg | ORAL_TABLET | Freq: Four times a day (QID) | ORAL | Status: DC | PRN

## 2015-01-26 MED ORDER — SODIUM CHLORIDE 0.9 % IJ SOLN
3.0000 mL | Freq: Two times a day (BID) | INTRAMUSCULAR | Status: DC
  Administered 2015-01-26: 3 mL via INTRAVENOUS

## 2015-01-26 MED ORDER — ALBUTEROL SULFATE (2.5 MG/3ML) 0.083% IN NEBU
2.5000 mg | INHALATION_SOLUTION | RESPIRATORY_TRACT | Status: DC | PRN
Start: 2015-01-26 — End: 2015-01-26

## 2015-01-26 MED ORDER — DEXTROSE 5 % IV SOLN
1.0000 g | INTRAVENOUS | Status: DC
  Filled 2015-01-26: qty 10

## 2015-01-26 MED ORDER — CEFTRIAXONE SODIUM 1 G IJ SOLR: 1.0000 g | INTRAMUSCULAR | Status: DC

## 2015-01-26 MED ORDER — THIAMINE HCL 100 MG PO TABS: 100.0000 mg | ORAL_TABLET | Freq: Every day | ORAL | Status: AC

## 2015-01-26 MED ORDER — PRAVASTATIN SODIUM 20 MG PO TABS: 20.0000 mg | ORAL_TABLET | Freq: Every day | ORAL | Status: DC

## 2015-01-26 NOTE — Discharge Summary (Signed)
Physician Discharge Summary  Bryan Simon YQM:[id number] DOB: May 23, 1945 DOA: 01/25/2015  PCP: No PCP Per Patient  Admit date: 01/25/2015 Discharge date: 01/26/2015  Time spent: 35 minutes  Recommendations for Outpatient Follow-up:  Needs Bmet to follow renal function.  Resume BP medications if no further hypotension.  Follow urine culture.   Discharge Diagnoses:  Principal Problem:   Acute encephalopathy Active Problems:   Acute kidney injury (HCC)   Hypotension   Anemia   Diabetes mellitus without complication (HCC)   AKI (acute kidney injury) (HCC)   Discharge Condition: stable.   Diet recommendation: heart healthy  Filed Weights   01/25/15 2040  Weight: 72.576 kg (160 lb)    History of present illness:  Patient is a 69 year old male with a past medical history of alcohol abuse, tobacco abuse, hypertension, diabetes mellitus type 2, and reported cirrhosis of the liver; who presents with complaints of slurred speech and disorientation. Patient wife and family member are present at bedside and give history as patient does not recall the events in question. Family notes he was last seen normal around 5 or 6 PM this afternoon. The patient himself notes that around 4 PM that he smoked some weed that he got from some person off the street. Patient was found to have slurred speech and was disoriented not recognizing family members around 6:30 PM. And noted associated symptoms of patient stumbling around but denies any falls. They denied any signs of facial droop or focal weakness. The patient notes smokes weed occasionally and reports that he quit drinking alcohol last week. He reports that he is intermittently had tremors off and on during this past week. Also complaining of what sounds like constipation for which she took a laxative this afternoon, but has not had a bowel movement yet.  Patient also reports a three-week history of nosebleeds off and on which she has a appointment at  the [facility] hospital to see his primary care doctor tomorrow. He was just seen last week at the [facility] hospital with an nosebleeds for which I started him on Protonix.   Upon admission into the emergency department the patient was initially labeled as a code stroke. Initial CT of the head was negative. After more history obtained code stroke was canceled and UDS was found to be positive for marijuana.  Hospital Course:  Acute encephalopathy: Family reports acute onset of slurred speech and disorientation around 6:30 PM. Patient reports smoking a blunt around 4 PM. Initial CT scan of the brain negative for any acute abnormality. UDS positive for marijuana so suspect that this is likely cause for patient's acute altered status however cannot rule out a stroke versus alcohol related. - Physical therapy to eval and treat  - symptoms has resolved. He decline MRI brain or further evaluation. He wants to be discharge. Significant other at bedside, relates patient is back to baseline.   Acute kidney injury : Patient's creatinine was elevated to 2.09 on admission, baseline creatinine previously was somewhere around 1.2 about 7 days ago. - IV fluids - Hold hydrochlorothiazide and lisinopril -renal function improved. Cr has decrease to 1.4.    Hypotension in a patient with a history of hypertension: Improved with IV fluids initial blood pressure was 84/53 now BP .  - Held home medications of lisinopril/ hydrochlorothiazide -wants to be discharge. SBP in the 140. Hold diuretics at discharge  Leukocytosis: WBC 13.2 Possibly reactive. Although patient was found to have many bacteria on UA with no LE, nitrate, and/or  squamous epithelial cells seen. - checking urine culture - IV Rocephin -discharge on cipro. He does not want to stay in the hospital   Anemia: Hemoglobin appears to be stable at 10.3:  - Repeat CBC in a.m.   Diabetes mellitus with hyperglycemia : Patient found to have elevated blood glucose  of 285 on admission with a anion gap of 11. - check HbgA1c pending.  - CBG's with sliding scale insulin every 4 hours   Alcohol abuse history: Patient notes last drink one week ago. Question of possibility of alcohol withdrawal. - Banana bag - CWIAA protocol -alert, oriented. Wants to be discharge. He has not required ativan.   Recurrent nose bleeds: Patient found to have a mucosal tear of the right nare. Patient scheduled to follow-up with primary care tomorrow regarding this issue. - Continue to monitor consider afrin nasal spray or rhino rocket if reoccurs -no evidence of bleeding.   Procedures:  none  Consultations:  none  Discharge Exam: Filed Vitals:   01/26/15 0000 01/26/15 0035  BP: 152/90 140/78  Pulse: 72 74  Temp:  98.4 F (36.9 C)  Resp:  20    General: NAD Cardiovascular: S 1, S 2 RRR Respiratory: CTA Neuro; non focal.   Discharge Instructions   Discharge Instructions    Diet - low sodium heart healthy    Complete by:  As directed      Increase activity slowly    Complete by:  As directed           Current Discharge Medication List    START taking these medications   Details  ciprofloxacin (CIPRO) 250 MG tablet Take 1 tablet (250 mg total) by mouth 2 (two) times daily. Qty: 6 tablet, Refills: 0    folic acid (FOLVITE) 1 MG tablet Take 1 tablet (1 mg total) by mouth daily. Qty: 30 tablet, Refills: 0    thiamine 100 MG tablet Take 1 tablet (100 mg total) by mouth daily. Qty: 30 tablet, Refills: 0      CONTINUE these medications which have NOT CHANGED   Details  glipiZIDE (GLUCOTROL) 10 MG tablet Take 10 mg by mouth 2 (two) times daily before a meal.    Multiple Vitamin (MULTIVITAMIN WITH MINERALS) TABS tablet Take 1 tablet by mouth daily.    omeprazole (PRILOSEC) 20 MG capsule Take 20 mg by mouth 2 (two) times daily as needed (for heartburn).    pravastatin (PRAVACHOL) 40 MG tablet Take 20 mg by mouth daily.    risperidone (RISPERDAL)  4 MG tablet Take 4 mg by mouth at bedtime.    famotidine (PEPCID) 20 MG tablet Take 1 tablet (20 mg total) by mouth 2 (two) times daily. Qty: 60 tablet, Refills: 2      STOP taking these medications     hydrochlorothiazide (HYDRODIURIL) 25 MG tablet      lisinopril (PRINIVIL,ZESTRIL) 40 MG tablet      pantoprazole (PROTONIX) 20 MG tablet        No Known Allergies Follow-up Information    Please follow up.   Why:  follow up with PCP in 2 days        The results of significant diagnostics from this hospitalization (including imaging, microbiology, ancillary and laboratory) are listed below for reference.    Significant Diagnostic Studies: Ct Head Wo Contrast  01/25/2015  CLINICAL DATA:  Sudden onset of altered mental status and slurred speech today at 6:30 p.m. EXAM: CT HEAD WITHOUT CONTRAST TECHNIQUE: Contiguous axial images  were obtained from the base of the skull through the vertex without intravenous contrast. COMPARISON:  None. FINDINGS: The ventricles are in the midline without mass effect or shift. They are normal in size and configuration for age and degree of cerebral atrophy no CT findings for hemispheric infarction or intracranial hemorrhage. Benign-appearing basal ganglia calcifications. No extra-axial fluid collections are identified no mass lesions. The brainstem and cerebellum are grossly normal. The bony structures are intact. The paranasal sinuses and mastoid air cells are clear. The globes are intact. IMPRESSION: No acute intracranial findings or mass lesions. Electronically Signed   By: Rudie Meyer M.D.   On: 01/25/2015 21:38   Dg Chest Port 1 View  01/25/2015  CLINICAL DATA:  Shortness of Breath EXAM: PORTABLE CHEST - 1 VIEW COMPARISON:  None. FINDINGS: The heart size and mediastinal contours are within normal limits. Both lungs are clear. The visualized skeletal structures are unremarkable. IMPRESSION: No active disease. Electronically Signed   By: Alcide Clever  M.D.   On: 01/25/2015 21:05    Microbiology: Recent Results (from the past 240 hour(s))  Blood Culture (routine x 2)     Status: None (Preliminary result)   Collection Time: 01/25/15  8:54 PM  Result Value Ref Range Status   Specimen Description BLOOD LEFT HAND  Final   Special Requests IN PEDIATRIC BOTTLE [facility]  Final   Culture PENDING  Incomplete   Report Status PENDING  Incomplete     Labs: Basic Metabolic Panel:  Recent Labs Lab 01/25/15 2018 01/25/15 2054 01/26/15 0520  NA 136 135 137  K 3.7 4.9 4.1  CL 102 102 106  CO2  --  22 22  GLUCOSE 253* 285* 157*  BUN CREATININE 1.90* 2.09* 1.44*  CALCIUM  --  9.1 8.9   Liver Function Tests:  Recent Labs Lab 01/25/15 2054  AST 26  ALT 19  ALKPHOS 86  BILITOT 0.9  PROT 6.7  ALBUMIN 3.5   No results for input(s): LIPASE, AMYLASE in the last 168 hours. No results for input(s): AMMONIA in the last 168 hours. CBC:  Recent Labs Lab 01/25/15 2018 01/25/15 2054 01/26/15 0520  WBC  --  13.2* 12.3*  NEUTROABS  --  10.4*  --   HGB 10.5* 10.3* 9.7*  HCT 31.0* 28.4* 27.0*  MCV  --  84.0 83.6  PLT  --  179 189   Cardiac Enzymes: No results for input(s): CKTOTAL, CKMB, CKMBINDEX, TROPONINI in the last 168 hours. BNP: BNP (last 3 results) No results for input(s): BNP in the last 8760 hours.  ProBNP (last 3 results) No results for input(s): PROBNP in the last 8760 hours.  CBG:  Recent Labs Lab 01/25/15 1955 01/26/15 0050 01/26/15 0459 01/26/15 0901  GLUCAP 269* 298* 183* 70       Signed:  Renji Berwick A  [facility] 01/26/2015, 9:13 AM

## 2015-01-26 NOTE — Progress Notes (Signed)
Pt aware that [facility] is to [facility]. Pt states he's not going to go there. Education given to patient.

## 2015-01-26 NOTE — ED Notes (Signed)
Pt transported to and from restroom with this RN.

## 2015-01-26 NOTE — Progress Notes (Signed)
Pt has orders to be discharged. Discharge instructions given to patient and family; no additional questions at this time. Medication regimen reviewed and pt and family educated. Prescriptions given. Pt/family verbalized understanding and has no additional questions. Telemetry box removed. IVs removed and sites in good condition. Pt stable and waiting for transportation.

## 2015-01-27 LAB — HEMOGLOBIN A1C
Hgb A1c MFr Bld: 7.3 % — ABNORMAL HIGH (ref 4.8–5.6)
Mean Plasma Glucose: 163 mg/dL

## 2015-01-30 LAB — CULTURE, BLOOD (ROUTINE X 2)
CULTURE: NO GROWTH
CULTURE: NO GROWTH

## 2015-12-25 ENCOUNTER — Encounter (HOSPITAL_COMMUNITY): Payer: Self-pay

## 2015-12-25 ENCOUNTER — Emergency Department (HOSPITAL_COMMUNITY)
Admission: EM | Admit: 2015-12-25 | Discharge: 2015-12-25 | Disposition: A | Discharge status: Home / Self Care | Payer: Medicare Other | Attending: Emergency Medicine | Admitting: Emergency Medicine

## 2015-12-25 ENCOUNTER — Emergency Department (HOSPITAL_COMMUNITY): Payer: Medicare Other

## 2015-12-25 DIAGNOSIS — Z794 Long term (current) use of insulin: Secondary | ICD-10-CM | POA: Insufficient documentation

## 2015-12-25 DIAGNOSIS — R079 Chest pain, unspecified: Secondary | ICD-10-CM | POA: Diagnosis present

## 2015-12-25 DIAGNOSIS — Z79899 Other long term (current) drug therapy: Secondary | ICD-10-CM | POA: Diagnosis not present

## 2015-12-25 DIAGNOSIS — E119 Type 2 diabetes mellitus without complications: Secondary | ICD-10-CM | POA: Insufficient documentation

## 2015-12-25 DIAGNOSIS — R0789 Other chest pain: Secondary | ICD-10-CM | POA: Insufficient documentation

## 2015-12-25 DIAGNOSIS — F17200 Nicotine dependence, unspecified, uncomplicated: Secondary | ICD-10-CM | POA: Diagnosis not present

## 2015-12-25 DIAGNOSIS — I10 Essential (primary) hypertension: Secondary | ICD-10-CM | POA: Diagnosis not present

## 2015-12-25 DIAGNOSIS — R0600 Dyspnea, unspecified: Secondary | ICD-10-CM | POA: Diagnosis not present

## 2015-12-25 LAB — I-STAT TROPONIN, ED: Troponin i, poc: 0 ng/mL (ref 0.00–0.08)

## 2015-12-25 LAB — BASIC METABOLIC PANEL
Anion gap: 12 (ref 5–15)
BUN: 22 mg/dL — AB (ref 6–20)
CALCIUM: 9.2 mg/dL (ref 8.9–10.3)
CO2: 21 mmol/L — ABNORMAL LOW (ref 22–32)
Chloride: 99 mmol/L — ABNORMAL LOW (ref 101–111)
Creatinine, Ser: 1.5 mg/dL — ABNORMAL HIGH (ref 0.61–1.24)
GFR calc Af Amer: 53 mL/min — ABNORMAL LOW (ref >60)
GFR, EST NON AFRICAN AMERICAN: 46 mL/min — AB (ref >60)
GLUCOSE: 158 mg/dL — AB (ref 65–99)
Potassium: 3.5 mmol/L (ref 3.5–5.1)
SODIUM: 132 mmol/L — AB (ref 135–145)

## 2015-12-25 LAB — CBC
HCT: 44.3 % (ref 39.0–52.0)
Hemoglobin: 16.5 g/dL (ref 13.0–17.0)
MCH: 29.5 pg (ref 26.0–34.0)
MCHC: 37.2 g/dL — ABNORMAL HIGH (ref 30.0–36.0)
MCV: 79.1 fL (ref 78.0–100.0)
Platelets: 145 K/uL — ABNORMAL LOW (ref 150–400)
RBC: 5.6 MIL/uL (ref 4.22–5.81)
RDW: 13.7 % (ref 11.5–15.5)
WBC: 9.8 K/uL (ref 4.0–10.5)

## 2015-12-25 LAB — CBG MONITORING, ED: GLUCOSE-CAPILLARY: 265 mg/dL — AB (ref 65–99)

## 2015-12-25 MED ORDER — ALBUTEROL SULFATE HFA 108 (90 BASE) MCG/ACT IN AERS: 1.0000 | INHALATION_SPRAY | Freq: Four times a day (QID) | RESPIRATORY_TRACT | 0 refills | Status: AC | PRN

## 2015-12-25 MED ORDER — PREDNISONE 20 MG PO TABS
60.0000 mg | ORAL_TABLET | Freq: Once | ORAL | Status: AC
  Administered 2015-12-25: 60 mg via ORAL
  Filled 2015-12-25: qty 3

## 2015-12-25 MED ORDER — IPRATROPIUM-ALBUTEROL 0.5-2.5 (3) MG/3ML IN SOLN
3.0000 mL | Freq: Once | RESPIRATORY_TRACT | Status: AC
  Administered 2015-12-25: 3 mL via RESPIRATORY_TRACT
  Filled 2015-12-25: qty 3

## 2015-12-25 MED ORDER — ALBUTEROL SULFATE HFA 108 (90 BASE) MCG/ACT IN AERS
2.0000 | INHALATION_SPRAY | Freq: Once | RESPIRATORY_TRACT | Status: AC
  Administered 2015-12-25: 2 via RESPIRATORY_TRACT
  Filled 2015-12-25: qty 6.7

## 2015-12-25 MED ORDER — PREDNISONE 20 MG PO TABS: 40.0000 mg | ORAL_TABLET | Freq: Every day | ORAL | 0 refills | Status: AC

## 2015-12-25 NOTE — Discharge Instructions (Signed)
We believe that your symptoms are caused today by an exacerbation of your COPD, and possibly bronchitis.  Please take the prescribed medications and any medications that you have at home for your COPD.  Follow up with your doctor as recommended.  If you develop any new or worsening symptoms, including but not limited to fever, persistent vomiting, worsening shortness of breath, or other symptoms that concern you, please return to the Emergency Department immediately. ° ° °Chronic Obstructive Pulmonary Disease °Chronic obstructive pulmonary disease (COPD) is a common lung condition in which airflow from the lungs is limited. COPD is a general term that can be used to describe many different lung problems that limit airflow, including both chronic bronchitis and emphysema.  If you have COPD, your lung function will probably never return to normal, but there are measures you can take to improve lung function and make yourself feel better.  °CAUSES  °Smoking (common).   °Exposure to secondhand smoke.   °Genetic problems. °Chronic inflammatory lung diseases or recurrent infections. °SYMPTOMS  °Shortness of breath, especially with physical activity.   °Deep, persistent (chronic) cough with a large amount of thick mucus.   °Wheezing.   °Rapid breaths (tachypnea).   °Gray or bluish discoloration (cyanosis) of the skin, especially in fingers, toes, or lips.   °Fatigue.   °Weight loss.   °Frequent infections or episodes when breathing symptoms become much worse (exacerbations).   °Chest tightness. °DIAGNOSIS  °Your health care provider will take a medical history and perform a physical examination to make the initial diagnosis.  Additional tests for COPD may include:  °Lung (pulmonary) function tests. °Chest X-ray. °CT scan. °Blood tests. °TREATMENT  °Treatment available to help you feel better when you have COPD includes:  °Inhaler and nebulizer medicines. These help manage the symptoms of COPD and make your breathing more  comfortable. °Supplemental oxygen. Supplemental oxygen is only helpful if you have a low oxygen level in your blood.   °Exercise and physical activity. These are beneficial for nearly all people with COPD. Some people may also benefit from a pulmonary rehabilitation program. °HOME CARE INSTRUCTIONS  °Take all medicines (inhaled or pills) as directed by your health care provider. °Avoid over-the-counter medicines or cough syrups that dry up your airway (such as antihistamines) and slow down the elimination of secretions unless instructed otherwise by your health care provider.   °If you are a smoker, the most important thing that you can do is stop smoking. Continuing to smoke will cause further lung damage and breathing trouble. Ask your health care provider for help with quitting smoking. He or she can direct you to community resources or hospitals that provide support. °Avoid exposure to irritants such as smoke, chemicals, and fumes that aggravate your breathing. °Use oxygen therapy and pulmonary rehabilitation if directed by your health care provider. If you require home oxygen therapy, ask your health care provider whether you should purchase a pulse oximeter to measure your oxygen level at home.   °Avoid contact with individuals who have a contagious illness. °Avoid extreme temperature and humidity changes. °Eat healthy foods. Eating smaller, more frequent meals and resting before meals may help you maintain your strength. °Stay active, but balance activity with periods of rest. Exercise and physical activity will help you maintain your ability to do things you want to do. °Preventing infection and hospitalization is very important when you have COPD. Make sure to receive all the vaccines your health care provider recommends, especially the pneumococcal and influenza   vaccines. Ask your health care provider whether you need a pneumonia vaccine. °Learn and use relaxation techniques to manage stress. °Learn and  use controlled breathing techniques as directed by your health care provider. Controlled breathing techniques include:   °Pursed lip breathing. Start by breathing in (inhaling) through your nose for 1 second. Then, purse your lips as if you were going to whistle and breathe out (exhale) through the pursed lips for 2 seconds.   °Diaphragmatic breathing. Start by putting one hand on your abdomen just above your waist. Inhale slowly through your nose. The hand on your abdomen should move out. Then purse your lips and exhale slowly. You should be able to feel the hand on your abdomen moving in as you exhale.   °Learn and use controlled coughing to clear mucus from your lungs. Controlled coughing is a series of short, progressive coughs. The steps of controlled coughing are:   °Lean your head slightly forward.   °Breathe in deeply using diaphragmatic breathing.   °Try to hold your breath for 3 seconds.   °Keep your mouth slightly open while coughing twice.   °Spit any mucus out into a tissue.   °Rest and repeat the steps once or twice as needed. °SEEK MEDICAL CARE IF:  °You are coughing up more mucus than usual.   °There is a change in the color or thickness of your mucus.   °Your breathing is more labored than usual.   °Your breathing is faster than usual.   °SEEK IMMEDIATE MEDICAL CARE IF:  °You have shortness of breath while you are resting.   °You have shortness of breath that prevents you from: °Being able to talk.   °Performing your usual physical activities.   °You have chest pain lasting longer than 5 minutes.   °Your skin color is more cyanotic than usual. °You measure low oxygen saturations for longer than 5 minutes with a pulse oximeter. °MAKE SURE YOU:  °Understand these instructions. °Will watch your condition. °Will get help right away if you are not doing well or get worse. °Document Released: 11/01/2004 Document Revised: 06/08/2013 Document Reviewed: 09/18/2012 °[employer]® Patient Information ©2015  [employer]. This information is not intended to replace advice given to you by your health care provider. Make sure you discuss any questions you have with your health care provider. ° °

## 2015-12-25 NOTE — ED Notes (Signed)
EDP notified of patient in trigeminy.

## 2015-12-25 NOTE — ED Triage Notes (Signed)
Patient complains of intermittent shortness of breath with left anterior chest pain with cough x 2 weeks. States that the CP is worse with cough, heavy smoker, NAD

## 2015-12-25 NOTE — ED Provider Notes (Signed)
Emergency Department Provider Note   I have reviewed the triage vital signs and the nursing notes.   HISTORY  Chief Complaint Shortness of Breath and Chest Pain   HPI Bryan Simon is a 70 y.o. male with PMH of DM and HTN presents to the ED for evaluation of intermittent left sided chest pain and dyspnea over the last 3 weeks. He is a heavy smoker for many years. He endorses chest pain only with smoking or coughing. His dyspnea worsened significantly this AM which prompted the ED presentation. No fever, chills, nausea, vomiting, or diarrhea. No known history of AMI. No exacerbating or alleviating factors.    Past Medical History:  Diagnosis Date  . Cirrhosis of liver (HCC)   . Diabetes mellitus without complication (HCC)   . Hypertension     Patient Active Problem List   Diagnosis Date Noted  . Acute encephalopathy 01/25/2015  . Acute kidney injury (HCC) 01/25/2015  . Hypotension 01/25/2015  . Anemia 01/25/2015  . Diabetes mellitus without complication (HCC) 01/25/2015  . AKI (acute kidney injury) (HCC) 01/25/2015    Past Surgical History:  Procedure Laterality Date  . WRIST SURGERY      Current Outpatient Rx  . Order #: [id number] Class: Historical Med  . Order #: [id number] Class: Historical Med  . Order #: [id number] Class: Historical Med  . Order #: [id number] Class: Historical Med  . Order #: [id number] Class: Historical Med  . Order #: [id number] Class: Historical Med  . Order #: [id number] Class: Historical Med  . Order #: [id number] Class: Historical Med  . Order #: [id number] Class: Historical Med  . Order #: [id number] Class: Historical Med  . Order #: [id number] Class: Historical Med  . Order #: [id number] Class: Print  . Order #: [id number] Class: Print  . Order #: [id number] Class: Print  . Order #: [id number] Class: Print  . Order #: [id number] Class: Print  . Order #: [id number] Class: Print    Allergies Patient has no known allergies.  Family History  Problem Relation  Age of Onset  . Diabetes Mother   . Hypertension Mother     Social History Social History  Substance Use Topics  . Smoking status: Heavy Tobacco Smoker  . Smokeless tobacco: Never Used  . Alcohol use 4.8 oz/week    8 Shots of liquor per week     Comment: most days    Review of Systems  Constitutional: No fever/chills Eyes: No visual changes. ENT: No sore throat. Cardiovascular: Positive chest pain with smoking.  Respiratory: Positive shortness of breath. Gastrointestinal: No abdominal pain.  No nausea, no vomiting.  No diarrhea.  No constipation. Genitourinary: Negative for dysuria. Musculoskeletal: Negative for back pain. Skin: Negative for rash. Neurological: Negative for headaches, focal weakness or numbness.  10-point ROS otherwise negative.  ____________________________________________   PHYSICAL EXAM:  VITAL SIGNS: ED Triage Vitals  Enc Vitals Group     BP 12/25/15 1006 146/96     Pulse Rate 12/25/15 1006 91     Resp 12/25/15 1006 20     Temp 12/25/15 1006 98.1 F (36.7 C)     Temp Source 12/25/15 1006 Oral     SpO2 12/25/15 1006 99 %     Weight 12/25/15 1007 186 lb (84.4 kg)     Height 12/25/15 1007 5\' 9"  (1.753 m)     Pain Score 12/25/15 1005 10   Constitutional: Alert and oriented. Well appearing and in no acute distress. Eyes: Conjunctivae are normal.  Head: Atraumatic. Nose: No congestion/rhinnorhea. Mouth/Throat: Mucous membranes are moist.  Oropharynx non-erythematous. Neck:  No stridor.  Cardiovascular: Normal rate, regular rhythm. Good peripheral circulation. Grossly normal heart sounds.   Respiratory: Normal respiratory effort.  No retractions. Lungs CTAB. Gastrointestinal: Soft and nontender. No distention.  Musculoskeletal: No lower extremity tenderness nor edema. No gross deformities of extremities. Neurologic:  Normal speech and language. No gross focal neurologic deficits are appreciated.  Skin:  Skin is warm, dry and intact. No rash  noted.  ____________________________________________   LABS (all labs ordered are listed, but only abnormal results are displayed)  Labs Reviewed  BASIC METABOLIC PANEL - Abnormal; Notable for the following:       Result Value   Sodium 132 (*)    Chloride 99 (*)    CO2 21 (*)    Glucose, Bld 158 (*)    BUN 22 (*)    Creatinine, Ser 1.50 (*)    GFR calc non Af Amer 46 (*)    GFR calc Af Amer 53 (*)    All other components within normal limits  CBC - Abnormal; Notable for the following:    MCHC 37.2 (*)    Platelets 145 (*)    All other components within normal limits  CBG MONITORING, ED - Abnormal; Notable for the following:    Glucose-Capillary 265 (*)    All other components within normal limits  I-STAT TROPOININ, ED   ____________________________________________  EKG   EKG Interpretation  Date/Time:  Sunday December 25 2015 10:09:37 EST Ventricular Rate:  91 PR Interval:  150 QRS Duration: 84 QT Interval:  358 QTC Calculation: 440 R Axis:   -22 Text Interpretation:  Sinus rhythm with occasional Premature ventricular complexes Right atrial enlargement Borderline ECG No STEMI.  Confirmed by Akshith Moncus MD, Cadon Raczka [phone]) on 12/25/2015 12:03:01 PM       ____________________________________________  RADIOLOGY  No results found.  ____________________________________________   PROCEDURES  Procedure(s) performed:   Procedures  None ____________________________________________   INITIAL IMPRESSION / ASSESSMENT AND PLAN / ED COURSE  Pertinent labs & imaging results that were available during my care of the patient were reviewed by me and considered in my medical decision making (see chart for details).  Patient presents to the ED with atypical chest pain and SOB for the last 3 weeks. He is a heavy smoker with faint wheezing on exam. No respiratory distress or active chest pain. Labs including troponin are normal or at baseline. CXR with no acute airspace  disease. Plan for albuterol duoneb and reassessment.   Labs and CXR unremarkable. Discussed smoking cessation with the patient in detail. He will attempt to cut back. Will discharge home with treatment for COPD flare.   At this time, I do not feel there is any life-threatening condition present. I have reviewed and discussed all results (EKG, imaging, lab, urine as appropriate), exam findings with patient. I have reviewed nursing notes and appropriate previous records.  I feel the patient is safe to be discharged home without further emergent workup. Discussed usual and customary return precautions. Patient and family (if present) verbalize understanding and are comfortable with this plan.  Patient will follow-up with their primary care provider. If they do not have a primary care provider, information for follow-up has been provided to them. All questions have been answered.  ____________________________________________  FINAL CLINICAL IMPRESSION(S) / ED DIAGNOSES  Final diagnoses:  Dyspnea, unspecified type  Atypical chest pain     MEDICATIONS GIVEN DURING THIS VISIT:  Medications  ipratropium-albuterol (DUONEB) 0.5-2.5 (3) MG/3ML nebulizer solution 3 mL (3 mLs  Nebulization Given 12/25/15 1216)  albuterol (PROVENTIL HFA;VENTOLIN HFA) 108 (90 Base) MCG/ACT inhaler 2 puff (2 puffs Inhalation Given 12/25/15 1358)  predniSONE (DELTASONE) tablet 60 mg (60 mg Oral Given 12/25/15 1358)     NEW OUTPATIENT MEDICATIONS STARTED DURING THIS VISIT:  Discharge Medication List as of 12/25/2015  1:40 PM    START taking these medications   Details  albuterol (PROVENTIL HFA;VENTOLIN HFA) 108 (90 Base) MCG/ACT inhaler Inhale 1-2 puffs into the lungs every 6 (six) hours as needed for wheezing or shortness of breath., Starting Sun 12/25/2015, Print    predniSONE (DELTASONE) 20 MG tablet Take 2 tablets (40 mg total) by mouth daily., Starting Mon 12/26/2015, Until Fri 12/30/2015, Print        Note:   This document was prepared using Dragon voice recognition software and may include unintentional dictation errors.  Alona BeneJoshua Vineet Kinney, MD Emergency Medicine   Maia PlanJoshua G Makenzi Bannister, MD 12/26/15 1125

## 2018-04-01 IMAGING — DX DG CHEST 2V
2 series · 2 of 2 positions shown · non-contrast
Comparison: 01/25/2015

CLINICAL DATA: Chest pain

EXAM:
CHEST  2 VIEW

[chest pa]
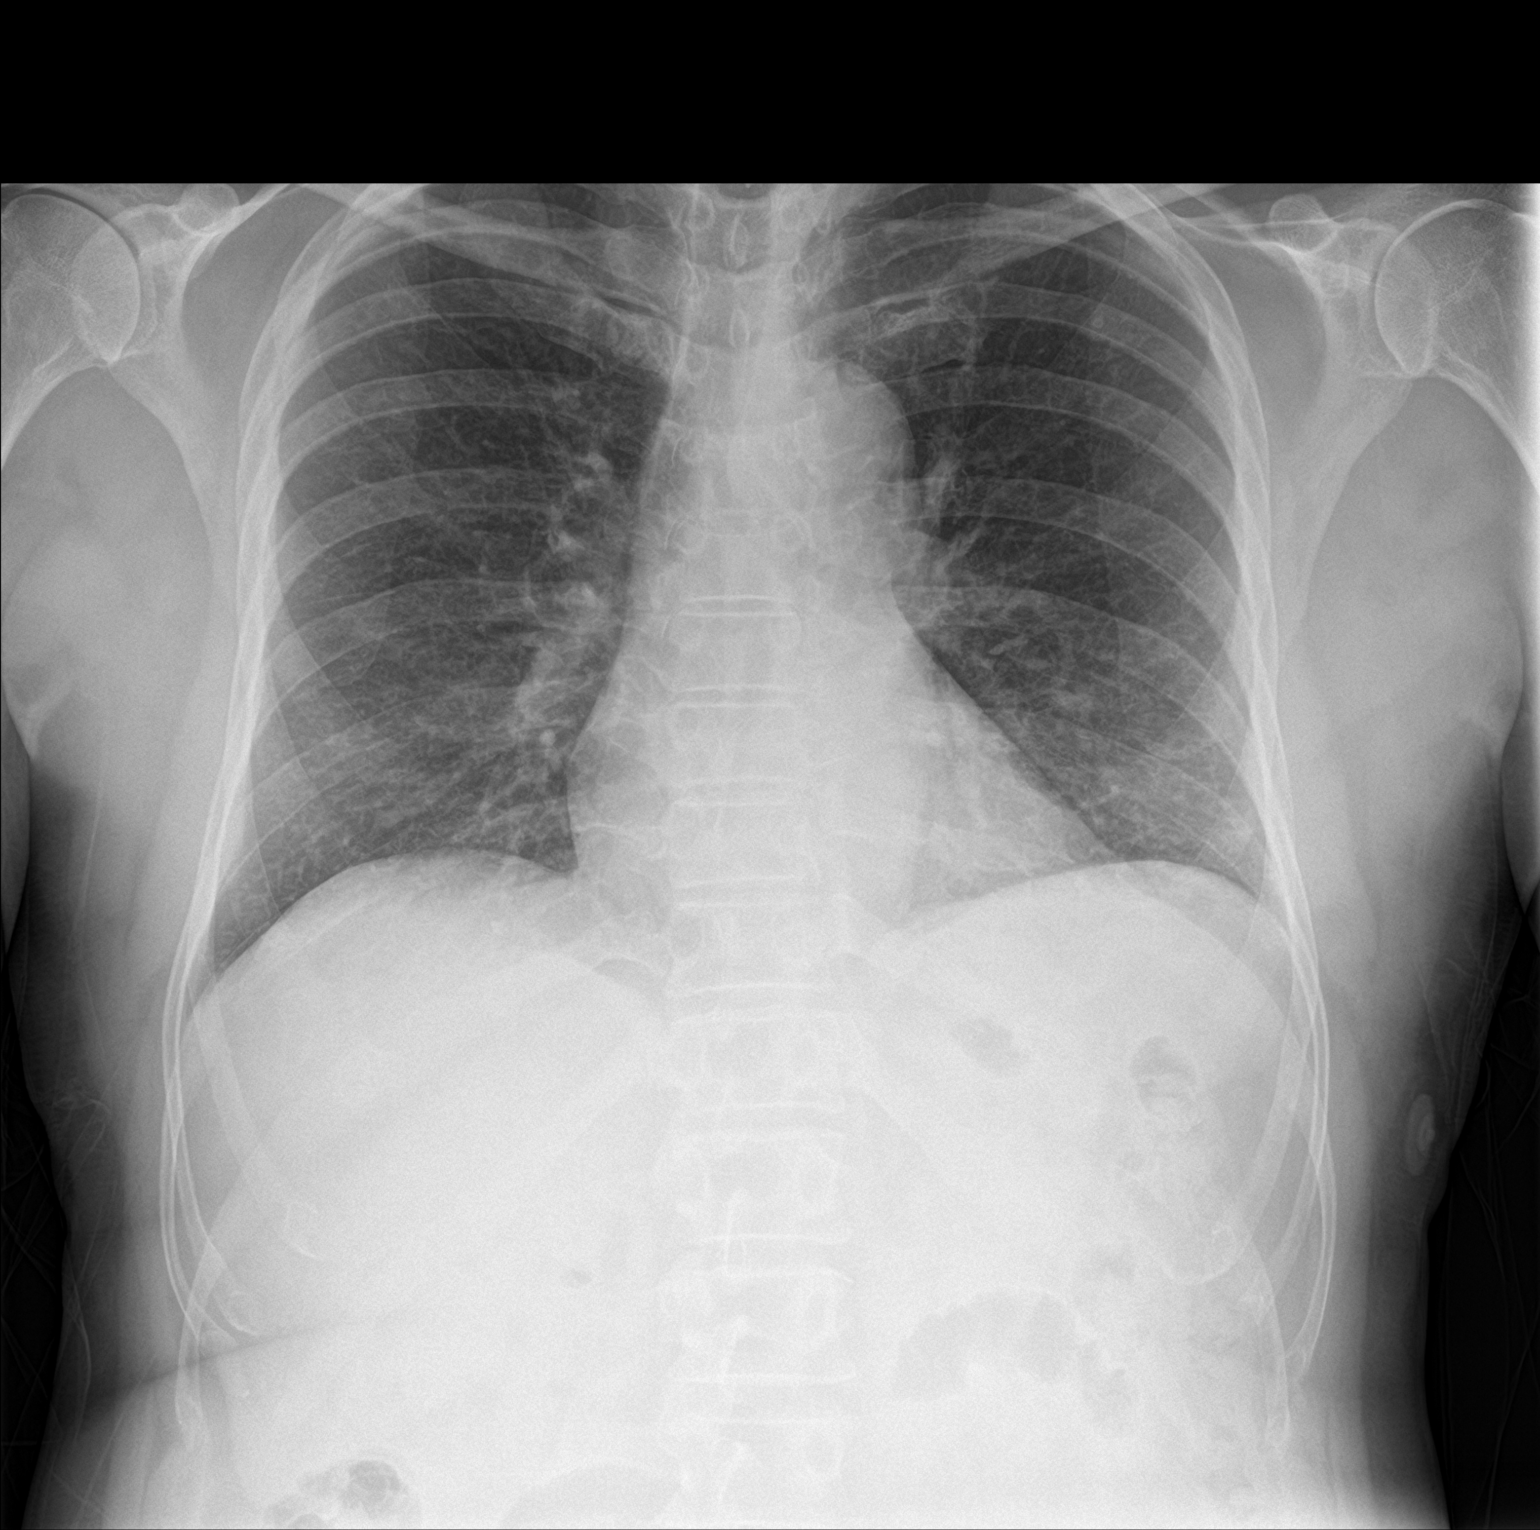

[chest lat]
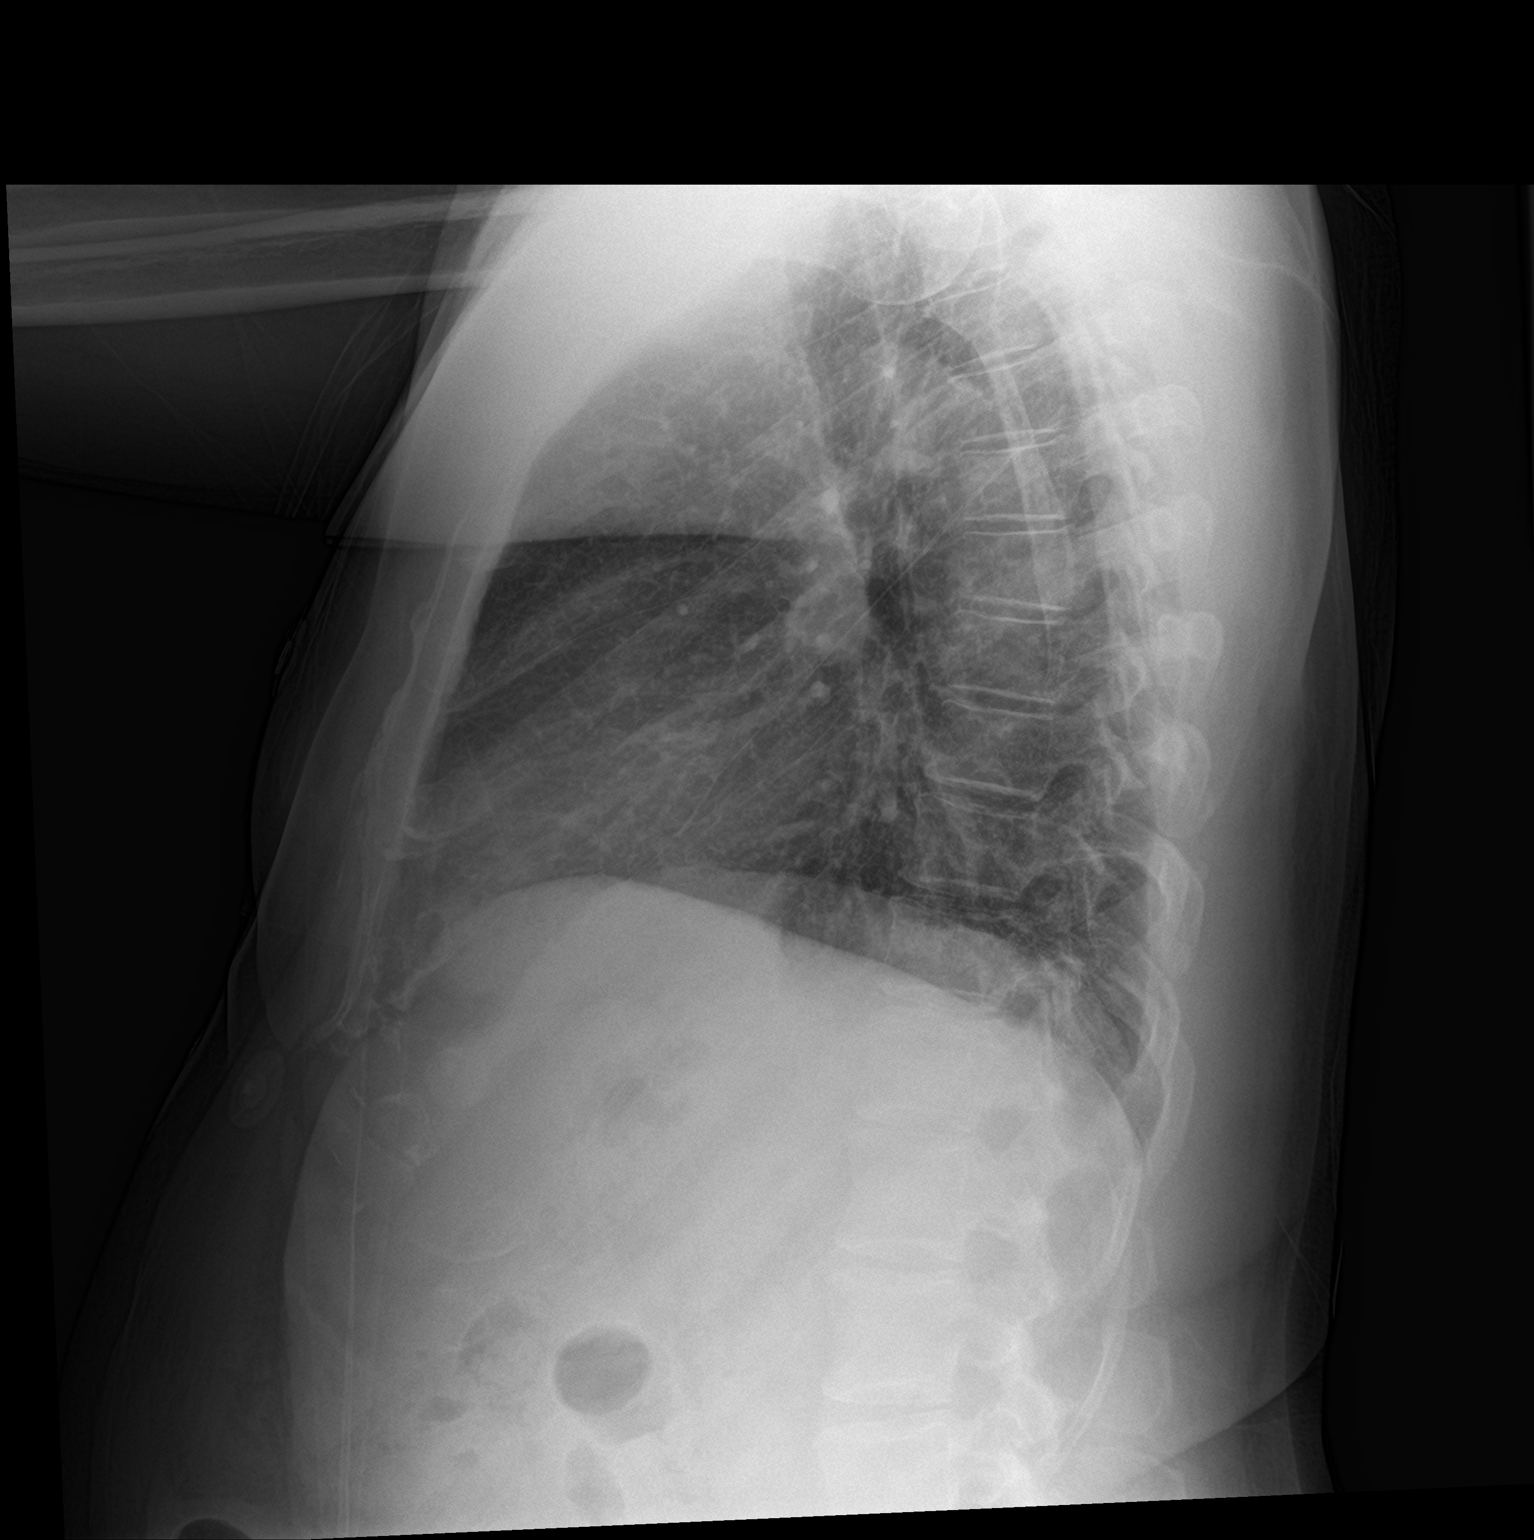

[2 of 2 positions shown; findings below may reference images not displayed]

FINDINGS: Heart and mediastinal contours are within normal limits. No focal
opacities or effusions. No acute bony abnormality.
IMPRESSION: No active cardiopulmonary disease.

## 2021-06-20 ENCOUNTER — Emergency Department (HOSPITAL_COMMUNITY)
Admission: EM | Admit: 2021-06-20 | Discharge: 2021-06-21 | Disposition: A | Discharge status: Home / Self Care | Payer: No Typology Code available for payment source | Attending: Emergency Medicine | Admitting: Emergency Medicine

## 2021-06-20 ENCOUNTER — Encounter (HOSPITAL_COMMUNITY): Payer: Self-pay | Admitting: Emergency Medicine

## 2021-06-20 ENCOUNTER — Emergency Department (HOSPITAL_COMMUNITY): Payer: No Typology Code available for payment source

## 2021-06-20 DIAGNOSIS — I639 Cerebral infarction, unspecified: Secondary | ICD-10-CM | POA: Insufficient documentation

## 2021-06-20 DIAGNOSIS — Z794 Long term (current) use of insulin: Secondary | ICD-10-CM | POA: Insufficient documentation

## 2021-06-20 DIAGNOSIS — Z20822 Contact with and (suspected) exposure to covid-19: Secondary | ICD-10-CM | POA: Insufficient documentation

## 2021-06-20 DIAGNOSIS — I10 Essential (primary) hypertension: Secondary | ICD-10-CM | POA: Diagnosis not present

## 2021-06-20 DIAGNOSIS — R2990 Unspecified symptoms and signs involving the nervous system: Secondary | ICD-10-CM

## 2021-06-20 DIAGNOSIS — R4781 Slurred speech: Secondary | ICD-10-CM | POA: Diagnosis not present

## 2021-06-20 DIAGNOSIS — E119 Type 2 diabetes mellitus without complications: Secondary | ICD-10-CM | POA: Insufficient documentation

## 2021-06-20 DIAGNOSIS — R202 Paresthesia of skin: Secondary | ICD-10-CM | POA: Diagnosis not present

## 2021-06-20 DIAGNOSIS — R531 Weakness: Secondary | ICD-10-CM | POA: Diagnosis present

## 2021-06-20 DIAGNOSIS — Z7984 Long term (current) use of oral hypoglycemic drugs: Secondary | ICD-10-CM | POA: Diagnosis not present

## 2021-06-20 DIAGNOSIS — Z79899 Other long term (current) drug therapy: Secondary | ICD-10-CM | POA: Diagnosis not present

## 2021-06-20 LAB — CBC WITH DIFFERENTIAL/PLATELET
Abs Immature Granulocytes: 0.03 10*3/uL (ref 0.00–0.07)
Basophils Absolute: 0.1 10*3/uL (ref 0.0–0.1)
Basophils Relative: 1 %
Eosinophils Absolute: 0.6 10*3/uL — ABNORMAL HIGH (ref 0.0–0.5)
Eosinophils Relative: 6 %
HCT: 30.9 % — ABNORMAL LOW (ref 39.0–52.0)
Hemoglobin: 11.3 g/dL — ABNORMAL LOW (ref 13.0–17.0)
Immature Granulocytes: 0 %
Lymphocytes Relative: 19 %
Lymphs Abs: 2 10*3/uL (ref 0.7–4.0)
MCH: 28.8 pg (ref 26.0–34.0)
MCHC: 36.6 g/dL — ABNORMAL HIGH (ref 30.0–36.0)
MCV: 78.6 fL — ABNORMAL LOW (ref 80.0–100.0)
Monocytes Absolute: 1 10*3/uL (ref 0.1–1.0)
Monocytes Relative: 9 %
Neutro Abs: 6.8 10*3/uL (ref 1.7–7.7)
Neutrophils Relative %: 65 %
Platelets: 245 10*3/uL (ref 150–400)
RBC: 3.93 MIL/uL — ABNORMAL LOW (ref 4.22–5.81)
RDW: 13.6 % (ref 11.5–15.5)
WBC: 10.6 10*3/uL — ABNORMAL HIGH (ref 4.0–10.5)
nRBC: 0 % (ref 0.0–0.2)

## 2021-06-20 LAB — URINALYSIS, ROUTINE W REFLEX MICROSCOPIC
Bilirubin Urine: NEGATIVE
Glucose, UA: NEGATIVE mg/dL
Hgb urine dipstick: NEGATIVE
Ketones, ur: NEGATIVE mg/dL
Leukocytes,Ua: NEGATIVE
Nitrite: NEGATIVE
Protein, ur: 30 mg/dL — AB
Specific Gravity, Urine: 1.028 (ref 1.005–1.030)
pH: 5 (ref 5.0–8.0)

## 2021-06-20 LAB — BASIC METABOLIC PANEL
Anion gap: 7 (ref 5–15)
BUN: 20 mg/dL (ref 8–23)
CO2: 23 mmol/L (ref 22–32)
Calcium: 10 mg/dL (ref 8.9–10.3)
Chloride: 106 mmol/L (ref 98–111)
Creatinine, Ser: 1.19 mg/dL (ref 0.61–1.24)
GFR, Estimated: >60 mL/min (ref >60)
Glucose, Bld: 296 mg/dL — ABNORMAL HIGH (ref 70–99)
Potassium: 4.1 mmol/L (ref 3.5–5.1)
Sodium: 136 mmol/L (ref 135–145)

## 2021-06-20 LAB — PROTIME-INR
INR: 1.1 (ref 0.8–1.2)
Prothrombin Time: 13.7 seconds (ref 11.4–15.2)

## 2021-06-20 LAB — RAPID URINE DRUG SCREEN, HOSP PERFORMED
Amphetamines: NOT DETECTED
Barbiturates: NOT DETECTED
Benzodiazepines: NOT DETECTED
Cocaine: NOT DETECTED
Opiates: NOT DETECTED
Tetrahydrocannabinol: NOT DETECTED

## 2021-06-20 LAB — APTT: aPTT: 35 seconds (ref 24–36)

## 2021-06-20 LAB — CBG MONITORING, ED
Glucose-Capillary: 335 mg/dL — ABNORMAL HIGH (ref 70–99)
Glucose-Capillary: 242 mg/dL — ABNORMAL HIGH (ref 70–99)

## 2021-06-20 LAB — MAGNESIUM: Magnesium: 1.1 mg/dL — ABNORMAL LOW (ref 1.7–2.4)

## 2021-06-20 LAB — ETHANOL: Alcohol, Ethyl (B): <10 mg/dL (ref <10)

## 2021-06-20 LAB — RESP PANEL BY RT-PCR (FLU A&B, COVID) ARPGX2
Influenza A by PCR: NEGATIVE
Influenza B by PCR: NEGATIVE
SARS Coronavirus 2 by RT PCR: NEGATIVE

## 2021-06-20 LAB — TROPONIN I (HIGH SENSITIVITY): Troponin I (High Sensitivity): 3 ng/L (ref <18)

## 2021-06-20 MED ORDER — IOHEXOL 350 MG/ML SOLN
80.0000 mL | Freq: Once | INTRAVENOUS | Status: AC | PRN
  Administered 2021-06-20: 80 mL via INTRAVENOUS

## 2021-06-20 MED ORDER — LORAZEPAM 1 MG PO TABS
1.0000 mg | ORAL_TABLET | Freq: Once | ORAL | Status: AC | PRN
  Administered 2021-06-20: 1 mg via ORAL
  Filled 2021-06-20: qty 1

## 2021-06-20 MED ORDER — MAGNESIUM SULFATE 2 GM/50ML IV SOLN
2.0000 g | Freq: Once | INTRAVENOUS | Status: AC
  Administered 2021-06-21: 2 g via INTRAVENOUS
  Filled 2021-06-20: qty 50

## 2021-06-20 MED ORDER — LORAZEPAM 2 MG/ML IJ SOLN: 1.0000 mg | Freq: Once | INTRAMUSCULAR | Status: DC

## 2021-06-20 NOTE — ED Provider Triage Note (Signed)
Emergency Medicine Provider Triage Evaluation Note ? ?Bryan Simon , a 76 y.o. male  was evaluated in triage.  Pt complains of numbness and tingling of the hands and feet, in addition to weakness of the left leg.  Had a stroke about 9 days ago on Sunday.  Had improvement of symptoms since then.  2 days ago started noticing recurrent symptoms.  Accompanied by family member, who states a different family member noticed worsening symptoms about 2 days ago.  Aphasia began at noon today.  Denies urinary/bowel incontinence, headache, memory issues, recent upper respiratory infection, fever. ? ?Review of Systems  ?Positive: As above ?Negative: As above ? ?Physical Exam  ?BP (!) 141/80 (BP Location: Right Arm)   Pulse 89   Temp 98 ?F (36.7 ?C) (Oral)   Resp 16   SpO2 98%  ?Gen:   Awake, no distress   ?Resp:  Normal effort, CTAB ?MSK:   See below ?Other:  Tongue deviation to the left.  Weakness of the left upper and left lower extremity, worst over the left lower extremity.  2+ pulses of DP and PT and radial bilaterally.  Normal EOMs.  PERRLA.  Abnormal gait.  Aphasia. ? ?Medical Decision Making  ?Medically screening exam initiated at 1:54 PM.  Appropriate orders placed.  Alyx Gee was informed that the remainder of the evaluation will be completed by another provider, this initial triage assessment does not replace that evaluation, and the importance of remaining in the ED until their evaluation is complete. ? ?POC glucose around 330 ? ?Labs, imaging ordered ? ?Discussed patient with attending. ?  ?Cecil Cobbs, PA-C ?06/20/21 1408 ? ?

## 2021-06-20 NOTE — ED Notes (Signed)
Patient transported to MRI 

## 2021-06-20 NOTE — Discharge Planning (Signed)
RNCM placed call to [employer] to obtain collaterals surrounding medical coverage. ?

## 2021-06-20 NOTE — ED Provider Notes (Signed)
?[facility] EMERGENCY DEPARTMENT ?Provider Note ? ? ?CSN: [id number] ?Arrival date & time: 06/20/21  1242 ? ?  ? ?History ? ?Chief Complaint  ?Patient presents with  ?? Numbness  ? ? ?Bryan Simon is a 76 y.o. male. ? ? Patient as above with significant medical history as below, including cirrhosis, DM, HTN who presents to the ED with complaint of weakness, numbness.  Patient was recently admitted around a week ago at OSH secondary strokelike symptoms.  Family reports after patient was discharged she was feeling weak for about 2 days but then symptoms improved.  Over the past 48 hours has been having worsening weakness and numbness to the left side of his body.  He has numbness in bilateral legs and feels numbness in the left leg is worse.  Also numbness in bilateral arms but numbness in the left arm is worse.  Feels that he is having reduced strength to his left upper extremity, left lower extremity patient also reported around 11am-12pm patient was having some slurred speech, word finding difficulty which has since improved.  Patient's been having waxing waning symptoms over the past 48 hours but does feel it has worsened today.  He remains symptomatic during my assessment. ? ? ? ? ?Past Medical History: ?No date: Cirrhosis of liver (HCC) ?No date: Diabetes mellitus without complication (HCC) ?No date: Hypertension ? ?Past Surgical History: ?No date: WRIST SURGERY  ? ? ?The history is provided by the patient and a relative. No language interpreter was used.  ? ?  ? ?Home Medications ?Prior to Admission medications   ?Medication Sig Start Date End Date Taking? Authorizing Provider  ?albuterol (PROVENTIL HFA;VENTOLIN HFA) 108 (90 Base) MCG/ACT inhaler Inhale 1-2 puffs into the lungs every 6 (six) hours as needed for wheezing or shortness of breath. 12/25/15   Long, Arlyss Repress, MD  ?ciprofloxacin (CIPRO) 250 MG tablet Take 1 tablet (250 mg total) by mouth 2 (two) times daily. ?Patient not taking:  Reported on 12/25/2015 01/26/15   Hartley Barefoot A, MD  ?famotidine (PEPCID) 20 MG tablet Take 1 tablet (20 mg total) by mouth 2 (two) times daily. ?Patient not taking: Reported on 12/25/2015 01/18/15   Maris Berger, MD  ?ferrous sulfate 325 (65 FE) MG tablet Take 325 mg by mouth 3 (three) times daily with meals.    [provider]  ?folic acid (FOLVITE) 1 MG tablet Take 1 tablet (1 mg total) by mouth daily. ?Patient not taking: Reported on 12/25/2015 01/26/15   Regalado, Jon Billings A, MD  ?glipiZIDE (GLUCOTROL) 10 MG tablet Take 10 mg by mouth 2 (two) times daily before a meal.    [provider]  ?hydrochlorothiazide (HYDRODIURIL) 25 MG tablet Take 25 mg by mouth daily.    [provider]  ?hydrOXYzine (ATARAX/VISTARIL) 25 MG tablet Take 25 mg by mouth 4 (four) times daily as needed for itching.    [provider]  ?insulin glargine (LANTUS) 100 UNIT/ML injection Inject 20 Units into the skin at bedtime.    [provider]  ?lisinopril (PRINIVIL,ZESTRIL) 40 MG tablet Take 40 mg by mouth daily.    [provider]  ?Multiple Vitamin (MULTIVITAMIN WITH MINERALS) TABS tablet Take 1 tablet by mouth daily.    [provider]  ?omeprazole (PRILOSEC) 20 MG capsule Take 20 mg by mouth 2 (two) times daily as needed (for heartburn).    [provider]  ?oxymetazoline (AFRIN) 0.05 % nasal spray Place 1 spray into both nostrils 2 (two)  times daily as needed for congestion.    [provider]  ?pravastatin (PRAVACHOL) 40 MG tablet Take 20 mg by mouth daily.    [provider]  ?risperidone (RISPERDAL) 4 MG tablet Take 4 mg by mouth at bedtime.    [provider]  ?thiamine 100 MG tablet Take 1 tablet (100 mg total) by mouth daily. ?Patient not taking: Reported on 12/25/2015 01/26/15   Alba Coryegalado, Belkys A, MD  ?   ? ?Allergies    ?Patient has no known allergies.   ? ?Review of Systems   ?Review of Systems  ?Constitutional:  Negative  for chills and fever.  ?HENT:  Negative for facial swelling and trouble swallowing.   ?Eyes:  Negative for photophobia and visual disturbance.  ?Respiratory:  Negative for cough and shortness of breath.   ?Cardiovascular:  Negative for chest pain and palpitations.  ?Gastrointestinal:  Negative for abdominal pain, nausea and vomiting.  ?Endocrine: Negative for polydipsia and polyuria.  ?Genitourinary:  Negative for difficulty urinating and hematuria.  ?Musculoskeletal:  Negative for gait problem and joint swelling.  ?Skin:  Negative for pallor and rash.  ?Neurological:  Positive for speech difficulty, weakness and numbness. Negative for syncope and headaches.  ?Psychiatric/Behavioral:  Negative for agitation and confusion.   ? ?Physical Exam ?Updated Vital Signs ?BP 122/63   Pulse 79   Temp 98 ?F (36.7 ?C) (Oral)   Resp 17   SpO2 97%  ?Physical Exam ?Vitals and nursing note reviewed.  ?Constitutional:   ?   General: He is not in acute distress. ?   Appearance: Normal appearance. He is well-developed. He is not ill-appearing or diaphoretic.  ?HENT:  ?   Head: Normocephalic and atraumatic.  ?   Right Ear: External ear normal.  ?   Left Ear: External ear normal.  ?   Mouth/Throat:  ?   Mouth: Mucous membranes are moist.  ?Eyes:  ?   General: No scleral icterus. ?   Extraocular Movements: Extraocular movements intact.  ?   Pupils: Pupils are equal, round, and reactive to light.  ?Cardiovascular:  ?   Rate and Rhythm: Normal rate and regular rhythm.  ?   Pulses: Normal pulses.  ?   Heart sounds: Normal heart sounds.  ?Pulmonary:  ?   Effort: Pulmonary effort is normal. No respiratory distress.  ?   Breath sounds: Normal breath sounds.  ?Abdominal:  ?   General: Abdomen is flat.  ?   Palpations: Abdomen is soft.  ?   Tenderness: There is no abdominal tenderness.  ?Musculoskeletal:     ?   General: Normal range of motion.  ?   Cervical back: Normal range of motion.  ?   Right lower leg: No edema.  ?   Left lower leg: No  edema.  ?Skin: ?   General: Skin is warm and dry.  ?   Capillary Refill: Capillary refill takes less than 2 seconds.  ?Neurological:  ?   Mental Status: He is alert and oriented to person, place, and time.  ?   GCS: GCS eye subscore is 4. GCS verbal subscore is 5. GCS motor subscore is 6.  ?   Cranial Nerves: Cranial nerves 2-12 are intact. No dysarthria.  ?   Sensory: Sensory deficit present.  ?   Motor: Weakness present. No pronator drift.  ?   Coordination: Coordination is intact. Finger-Nose-Finger Test normal.  ?   Comments: Reduced sensation to two-point discrimination left upper extremity left lower  extremity. ? ?4/5 strength left upper extremity, 5/5 strength right upper extremity ?4/5 strength left lower extremity, 5/5 strength right lower extremity  ?Psychiatric:     ?   Mood and Affect: Mood normal.     ?   Behavior: Behavior normal.  ? ? ?ED Results / Procedures / Treatments   ?Labs ?(all labs ordered are listed, but only abnormal results are displayed) ?Labs Reviewed  ?BASIC METABOLIC PANEL - Abnormal; Notable for the following components:  ?    Result Value  ? Glucose, Bld 296 (*)   ? All other components within normal limits  ?CBC WITH DIFFERENTIAL/PLATELET - Abnormal; Notable for the following components:  ? WBC 10.6 (*)   ? RBC 3.93 (*)   ? Hemoglobin 11.3 (*)   ? HCT 30.9 (*)   ? MCV 78.6 (*)   ? MCHC 36.6 (*)   ? Eosinophils Absolute 0.6 (*)   ? All other components within normal limits  ?URINALYSIS, ROUTINE W REFLEX MICROSCOPIC - Abnormal; Notable for the following components:  ? Protein, ur 30 (*)   ? Bacteria, UA RARE (*)   ? All other components within normal limits  ?MAGNESIUM - Abnormal; Notable for the following components:  ? Magnesium 1.1 (*)   ? All other components within normal limits  ?CBG MONITORING, ED - Abnormal; Notable for the following components:  ? Glucose-Capillary 335 (*)   ? All other components within normal limits  ?CBG MONITORING, ED - Abnormal; Notable for the following  components:  ? Glucose-Capillary 242 (*)   ? All other components within normal limits  ?RESP PANEL BY RT-PCR (FLU A&B, COVID) ARPGX2  ?ETHANOL  ?PROTIME-INR  ?APTT  ?RAPID URINE DRUG SCREEN, HOSP PERFORME

## 2021-06-20 NOTE — ED Triage Notes (Signed)
Patient here with complaint of continued numbness and tingling in his lower extremities after a stroke one and a half weeks ago. Daughter states symptoms were improving until Saturday, now patient complains of frequently about the tingling. Patient is alert, oriented, and in no apparent distress at this time. ?

## 2021-06-20 NOTE — ED Notes (Signed)
Patient ambulated to bathroom with one assist.

## 2021-06-20 NOTE — ED Provider Notes (Signed)
I went to evaluate the patient while he was in triage.  Patient reports 2 weeks ago he had a stroke.  All of this was done at the [facility].  Unclear specific management but reports that he has had some weakness on the left side since leaving the hospital.  Sounds like it is waxing and waning based on the family member who is present in the room.  She saw him yesterday and he seemed normal and today her sister called and said that he was having symptoms like when he had a stroke 2 weeks ago with left-sided weakness.  Patient was complaining of left-sided weakness in his arm and his leg but also complains of some weakness in his left hand.  His daughter who is present in the room also notes that he has had some aphasia today which is new.  However she is not exactly sure when the aphasia started.  She feels like it was shortly before they picked her up at 11 or 1130.  However it is not particularly clear.  Do not feel that the patient falls within the 3-hour window for thrombolytics.  Also with the story that seems to have waxing and waning symptoms feel that he does not meet criteria for code stroke activation.  Will discuss with neurology to confirm.  ? ?Spoke with Dr. Roda Shutters and pt is not a code stroke candidate at this time due ot low NIH of 2 and recent stroke not a TNK or thrombectomy candidate.  Will need stroke work up ?  ?Gwyneth Sprout, MD ?06/20/21 1442 ? ?

## 2021-06-21 LAB — CBG MONITORING, ED: Glucose-Capillary: 140 mg/dL — ABNORMAL HIGH (ref 70–99)

## 2021-06-21 NOTE — ED Provider Notes (Signed)
Patient signed out to me by Dr. Wallace Cullens.  Patient seen with new neurologic symptoms that occurred earlier today.  He has undergone MRI which shows area of stroke.  This was discussed with Dr. Wilford Corner.  He has reviewed the images and compared to the report from outside hospital.  Dr. Wilford Corner feels that this is the same stroke without any evidence of new stroke. ? ?Patient has had full stroke work-up and is on dual antiplatelet therapy.  He has previously declined rehab and therefore does not require hospitalization.  ?  ?Gilda Crease, MD ?06/21/21 0032 ? ?

## 2021-06-21 NOTE — ED Notes (Signed)
Patient given turkey sandwich and ginger ale.

## 2021-06-21 NOTE — Discharge Instructions (Signed)
The MRI did confirm a stroke from 2 weeks ago, but did not show a new stroke.  Please schedule outpatient follow-up with neurology as was previously recommended. ?

## 2023-09-26 IMAGING — CT CT ANGIO HEAD-NECK (W OR W/O PERF)
1 of 11 series · 5 of 33 positions shown · non-contrast
Comparison: None Available.

CLINICAL DATA: Neuro deficit, acute, stroke suspected

EXAM:
CT ANGIOGRAPHY HEAD AND NECK
TECHNIQUE: Multidetector CT imaging of the head and neck was performed using
the standard protocol during bolus administration of intravenous
contrast. Multiplanar CT image reconstructions and MIPs were
obtained to evaluate the vascular anatomy. Carotid stenosis
measurements (when applicable) are obtained utilizing NASCET
criteria, using the distal internal carotid diameter as the
denominator.

[Series 11: ax thins · axial · 0.39mm/px · z∈[+1114,+1334]mm · 5 of 331 slices shown]
[im 56/331  soft-tissue]
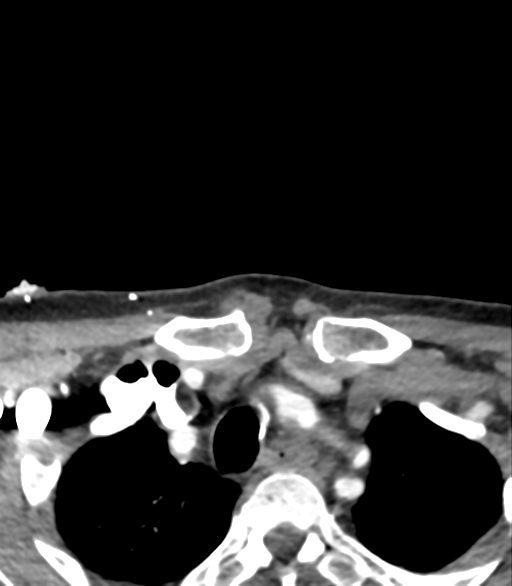
[im 111/331  bone]
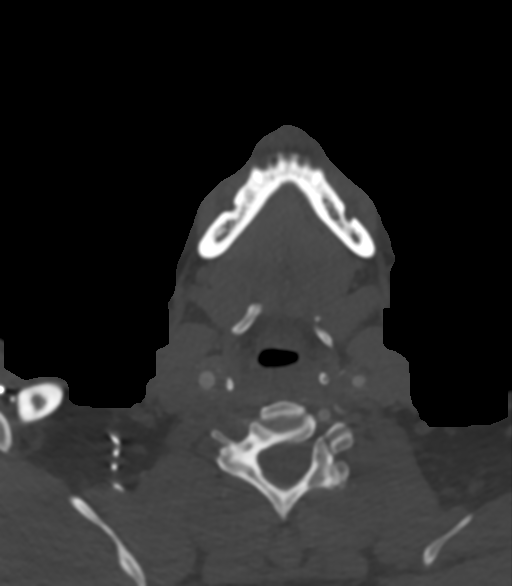
[im 166/331  soft-tissue]
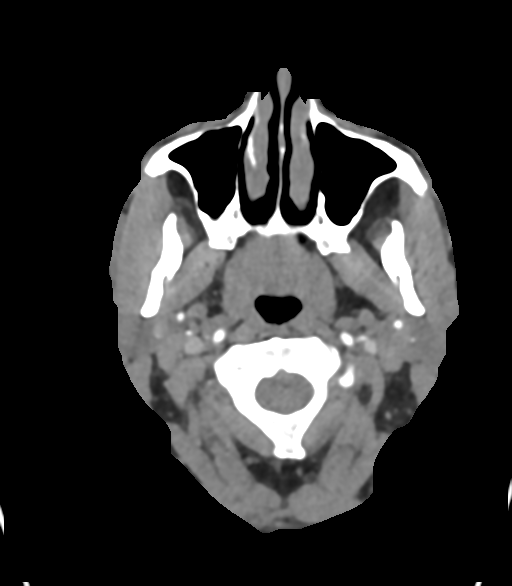
[im 221/331  bone]
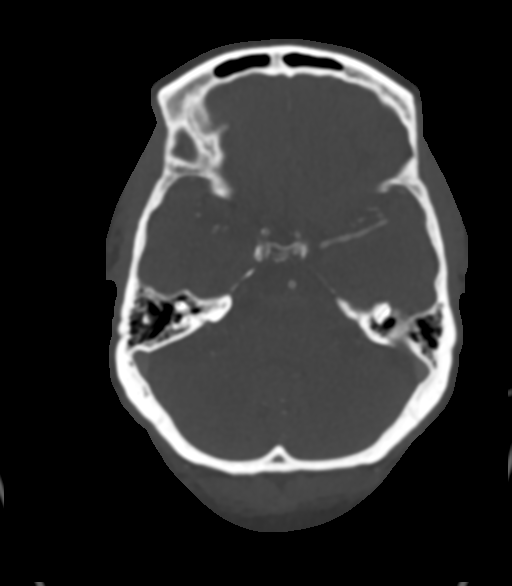
[im 276/331  soft-tissue]
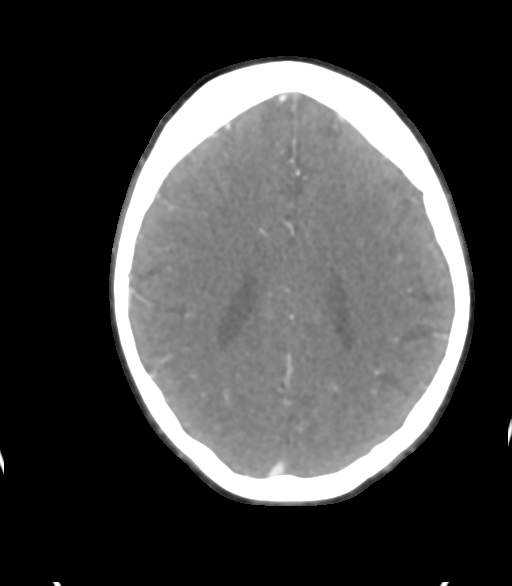

[5 of 33 positions shown; findings below may reference images not displayed]

RADIATION DOSE REDUCTION: This exam was performed according to the
departmental dose-optimization program which includes automated
exposure control, adjustment of the mA and/or kV according to
patient size and/or use of iterative reconstruction technique.

CONTRAST:  80mL OMNIPAQUE IOHEXOL 350 MG/ML SOLN
FINDINGS: CT HEAD

Brain: There is no acute intracranial hemorrhage, mass effect, or
edema. Gray-white differentiation is preserved. There is no
extra-axial fluid collection. Prominence of the ventricles and sulci
reflects parenchymal volume loss. Patchy hypoattenuation in the
supratentorial white matter is nonspecific may reflect microvascular
ischemic changes. Age-indeterminate small vessel infarct of the
right gangliocapsular region.

Vascular: No hyperdense vessel.

Skull: Calvarium is unremarkable.

Sinuses/Orbits: Evidence of prior sinonasal surgery. No significant
orbital abnormality.

Other: None.

Review of the MIP images confirms the above findings

CTA NECK

Aortic arch: Mild calcified plaque. Great vessel origins are patent.

Right carotid system: Patent. Mixed plaque along the common carotid
causing less than 50% stenosis. Primarily calcified plaque along the
proximal ICA causing less than 50% stenosis. Mixed plaque along the
more distal cervical ICA causing less than 50% stenosis.

Left carotid system: Patent. Eccentric noncalcified plaque along the
proximal common carotid causing less than 50% stenosis. Additional
noncalcified plaque along the common carotid causing 60% stenosis.
Mixed but primarily noncalcified plaque along the proximal internal
carotid causing 70% stenosis.

Vertebral arteries: Patent dominant extracranial left vertebral
artery. Plaque at the origin with mild stenosis. There is limited
opacification of the extracranial right vertebral artery primarily
at the distal V2 and V3 segments.

Skeleton: No acute osseous abnormality.

Other neck: Unremarkable.

Upper chest: No apical lung mass.

Review of the MIP images confirms the above findings

CTA HEAD

Anterior circulation: Intracranial internal carotid arteries are
patent with primarily calcified plaque. There is marked stenosis
particularly in the paraclinoid portions. Anterior and middle
cerebral arteries are patent.

Posterior circulation: Intracranial left vertebral artery is patent.
Minimal opacification of the intracranial right vertebral artery
distally from the PICA with irregularity. Probably reflects
retrograde flow. Basilar artery is patent. Posterior cerebral
arteries are patent. Right posterior communicating artery is
present.

Venous sinuses: Patent as allowed by contrast bolus timing.

Review of the MIP images confirms the above findings
IMPRESSION: No acute intracranial hemorrhage. Age-indeterminate small vessel
infarct of the right gangliocapsular region. Chronic microvascular
ischemic changes.

Plaque along the left common carotid causing up to 60% stenosis.
Plaque at the proximal left ICA causing 70% stenosis.

Multifocal plaque along the right common and internal carotids
causing less than 50% stenosis.

Minimal opacification of the extracranial right vertebral artery
likely reflecting chronic occlusion or proximal high-grade stenosis.

Intracranially, there is focal severe stenosis of both internal
carotid arteries. Minimal irregular flow in the right vertebral
artery distally beyond the PICA.
